# Patient Record
Sex: Female | Born: 1950 | Race: White | Hispanic: No | Marital: Single | Smoking: Never smoker
Health system: Southern US, Community
[De-identification: ages and names within clinical notes are randomized; demographics above are authoritative.]

## PROBLEM LIST (undated history)

## (undated) DIAGNOSIS — R112 Nausea with vomiting, unspecified: Secondary | ICD-10-CM

## (undated) DIAGNOSIS — I10 Essential (primary) hypertension: Secondary | ICD-10-CM

## (undated) DIAGNOSIS — I639 Cerebral infarction, unspecified: Secondary | ICD-10-CM

## (undated) DIAGNOSIS — Z960 Presence of urogenital implants: Secondary | ICD-10-CM

## (undated) DIAGNOSIS — J45909 Unspecified asthma, uncomplicated: Secondary | ICD-10-CM

## (undated) DIAGNOSIS — K219 Gastro-esophageal reflux disease without esophagitis: Secondary | ICD-10-CM

## (undated) DIAGNOSIS — M1990 Unspecified osteoarthritis, unspecified site: Secondary | ICD-10-CM

## (undated) DIAGNOSIS — I878 Other specified disorders of veins: Secondary | ICD-10-CM

## (undated) DIAGNOSIS — Z9889 Other specified postprocedural states: Secondary | ICD-10-CM

## (undated) HISTORY — PX: CHOLECYSTECTOMY: SHX55

## (undated) HISTORY — PX: OTHER SURGICAL HISTORY: SHX169

## (undated) HISTORY — PX: TONSILLECTOMY: SUR1361

## (undated) HISTORY — PX: ABDOMINAL HYSTERECTOMY: SHX81

---

## 2013-11-13 ENCOUNTER — Other Ambulatory Visit: Payer: Self-pay | Admitting: Sports Medicine

## 2013-11-13 DIAGNOSIS — M2390 Unspecified internal derangement of unspecified knee: Secondary | ICD-10-CM

## 2013-11-18 ENCOUNTER — Ambulatory Visit
Admission: RE | Admit: 2013-11-18 | Discharge: 2013-11-18 | Disposition: A | Discharge status: Home / Self Care | Payer: Medicare HMO | Source: Ambulatory Visit | Attending: Sports Medicine | Admitting: Sports Medicine

## 2013-11-18 DIAGNOSIS — M2390 Unspecified internal derangement of unspecified knee: Secondary | ICD-10-CM

## 2013-11-18 MED ORDER — IOHEXOL 180 MG/ML  SOLN
40.0000 mL | Freq: Once | INTRAMUSCULAR | Status: AC | PRN
  Administered 2013-11-18: 40 mL via INTRA_ARTICULAR

## 2016-05-03 NOTE — Progress Notes (Signed)
Scheduling pre op--please PLACE SURGICAL ORDERS IN EPIC  THANKS 

## 2016-05-06 NOTE — H&P (Signed)
TOTAL KNEE ADMISSION H&P  Patient is being admitted for left total knee arthroplasty.  Subjective:  Chief Complaint:    Left knee primary OA / pain  HPI: Regina Combs, 65 y.o. female, has a history of pain and functional disability in the left knee due to arthritis and has failed non-surgical conservative treatments for greater than 12 weeks to include NSAID's and/or analgesics, corticosteriod injections, viscosupplementation injections, use of assistive devices and activity modification.  Onset of symptoms was gradual, starting 2-3 years ago with gradually worsening course since that time. The patient noted prior procedures on the knee to include  arthroscopy on the left knee(s).  Patient currently rates pain in the left knee(s) at 7 out of 10 with activity. Patient has night pain, worsening of pain with activity and weight bearing, pain that interferes with activities of daily living, pain with passive range of motion, crepitus and joint swelling.  Patient has evidence of periarticular osteophytes and joint space narrowing by imaging studies.  There is no active infection.   Risks, benefits and expectations were discussed with the patient.  Risks including but not limited to the risk of anesthesia, blood clots, nerve damage, blood vessel damage, failure of the prosthesis, infection and up to and including death.  Patient understand the risks, benefits and expectations and wishes to proceed with surgery.   PCP: Leda QuailPURCELL, ANGELA, PA  D/C Plans:      Home  Post-op Meds:       No Rx given  Tranexamic Acid:      To be given - IV   Decadron:      Is to be given  FYI:     ASA  Norco  OPPT set up to start on 12/29     Past Medical History:  Diagnosis Date  . Arthritis    left knee osteoarthritis.  . Asthma    no recent issues since a yr ago-Uses Inhalers as needed.  Marland Kitchen. GERD (gastroesophageal reflux disease)    omeprazole helps.  . Hypertension   . PONV (postoperative nausea and vomiting)    . Poor venous access    "very difficult to get IV's"  . Status post implantation of urinary electronic stimulator device    right buttocks -remains inplace- Dr.Frank Mahalia Longestortora [id number][facility]  . Stroke [facility](HCC)    9'12 left sides [facility] x5 days.    Past Surgical History:  Procedure Laterality Date  . ABDOMINAL HYSTERECTOMY    . CHOLECYSTECTOMY     Laparoscopic - [facility], [place]  . TONSILLECTOMY    . Urinary Stimulator Right    buttocks-remains -has remote device.    No prescriptions prior to admission.   Allergies  Allergen Reactions  . Food Other (See Comments)    Egg whites-gastrointestinal symptoms  . Gluten Meal Other (See Comments)    gastrointestinal symptoms  . Sulfa Antibiotics Other (See Comments)    Childhood reaction; unsure of reaction.     Social History  Substance Use Topics  . Smoking status: Never Smoker  . Smokeless tobacco: Never Used  . Alcohol use Yes     Comment: social -rare       Review of Systems  Constitutional: Negative.   HENT: Negative.   Eyes: Negative.   Respiratory: Negative.   Cardiovascular: Negative.   Gastrointestinal: Positive for heartburn.  Genitourinary: Negative.   Musculoskeletal: Positive for joint pain.  Skin: Negative.   Neurological: Negative.   Endo/Heme/Allergies: Negative.  Psychiatric/Behavioral: Negative.     Objective:  Physical Exam  Constitutional: She is oriented to person, place, and time. She appears well-developed.  HENT:  Head: Normocephalic.  Eyes: Pupils are equal, round, and reactive to light.  Neck: Neck supple. No JVD present. No tracheal deviation present. No thyromegaly present.  Cardiovascular: Normal rate, regular rhythm, normal heart sounds and intact distal pulses.   Respiratory: Effort normal and breath sounds normal. No respiratory distress. She has no wheezes.  GI: Soft. There is no tenderness. There is no guarding.   Musculoskeletal:       Left knee: She exhibits decreased range of motion, swelling and bony tenderness. She exhibits no ecchymosis, no deformity, no laceration and no erythema. Tenderness found.  Lymphadenopathy:    She has no cervical adenopathy.  Neurological: She is alert and oriented to person, place, and time.  Skin: Skin is warm and dry.  Psychiatric: She has a normal mood and affect.      Imaging Review Plain radiographs demonstrate severe degenerative joint disease of the left knee. The bone quality appears to be good for age and reported activity level.  Assessment/Plan:  End stage arthritis, left knee   The patient history, physical examination, clinical judgment of the provider and imaging studies are consistent with end stage degenerative joint disease of the left knee and total knee arthroplasty is deemed medically necessary. The treatment options including medical management, injection therapy arthroscopy and arthroplasty were discussed at length. The risks and benefits of total knee arthroplasty were presented and reviewed. The risks due to aseptic loosening, infection, stiffness, patella tracking problems, thromboembolic complications and other imponderables were discussed. The patient acknowledged the explanation, agreed to proceed with the plan and consent was signed. Patient is being admitted for inpatient treatment for surgery, pain control, PT, OT, prophylactic antibiotics, VTE prophylaxis, progressive ambulation and ADL's and discharge planning. The patient is planning to be discharged home.      Anastasio AuerbachMatthew S. Dawnell Bryant   PA-C  05/22/2016, 9:09 AM

## 2016-05-15 NOTE — Patient Instructions (Addendum)
Regina CiproKaren Combs  05/15/2016   Your procedure is scheduled on: 05-22-16  [facility]  [facility] 3rd floor to  Short Stay Center at   1000 AM.  Call this number if you have problems the morning of surgery [phone]   Remember: ONLY 1 PERSON MAY GO WITH YOU TO SHORT STAY TO GET  READY MORNING OF YOUR SURGERY.  Do not eat food or drink liquids :After Midnight.Exception, may have from 12 midnight to 0600 AM- 2 cups Clear Juice or soft drink, then nothing.     Take these medicines the morning of surgery with A SIP OF WATER: Montelukast. Omeprazole.Regina Combs. Inhalers-usual. Reminder (Amlodipine) to be given on arrival. DO NOT TAKE ANY DIABETIC MEDICATIONS DAY OF YOUR SURGERY                               You may not have any metal on your body including hair pins and              piercings  Do not wear jewelry, make-up, lotions, powders or perfumes, deodorant             Do not wear nail polish.  Do not shave  48 hours prior to surgery.              Men may shave face and neck.   Do not bring valuables to the hospital. [facility] IS NOT             RESPONSIBLE   FOR VALUABLES.  Contacts, dentures or bridgework may not be worn into surgery.  Leave suitcase in the car. After surgery it may be brought to your room.     Patients discharged the day of surgery will not be allowed to drive home.  Name and phone number of your driver: Alycia RossettiRyan [phone] cell  Special Instructions: N/A              Please read over the following fact sheets you were given: _____________________________________________________________________             [facility] - Preparing [facility] Before surgery, you can play an important role.  Because skin is not sterile, your skin needs to be as free of germs as possible.  You can reduce the number of germs on your skin by washing with CHG (chlorahexidine gluconate) soap before surgery.  CHG is an  antiseptic cleaner which kills germs and bonds with the skin to continue killing germs even after washing. Please DO NOT use if you have an allergy to CHG or antibacterial soaps.  If your skin becomes reddened/irritated stop using the CHG and inform your nurse when you arrive at Short Stay. Do not shave (including legs and underarms) for at least 48 hours prior to the first CHG shower.  You may shave your face/neck. Please follow these instructions carefully:  1.  Shower with CHG Soap the night before surgery and the  morning of Surgery.  2.  If you choose to wash your hair, wash your hair first as usual with your  normal  shampoo.  3.  After you shampoo, rinse your hair and body thoroughly to remove the  shampoo.  4.  Use CHG as you would any other liquid soap.  You can apply chg directly  to the skin and wash                       Gently with a scrungie or clean washcloth.  5.  Apply the CHG Soap to your body ONLY FROM THE NECK DOWN.   Do not use on face/ open                           Wound or open sores. Avoid contact with eyes, ears mouth and genitals (private parts).                       Wash face,  Genitals (private parts) with your normal soap.             6.  Wash thoroughly, paying special attention to the area where your surgery  will be performed.  7.  Thoroughly rinse your body with warm water from the neck down.  8.  DO NOT shower/wash with your normal soap after using and rinsing off  the CHG Soap.                9.  Pat yourself dry with a clean towel.            10.  Wear clean pajamas.            11.  Place clean sheets on your bed the night of your first shower and do not  sleep with pets. Day of Surgery : Do not apply any lotions/deodorants the morning of surgery.  Please wear clean clothes to the hospital/surgery center.  FAILURE TO FOLLOW THESE INSTRUCTIONS MAY RESULT IN THE CANCELLATION OF YOUR SURGERY PATIENT  SIGNATURE_________________________________  NURSE SIGNATURE__________________________________  ________________________________________________________________________   Adam Phenix  An incentive spirometer is a tool that can help keep your lungs clear and active. This tool measures how well you are filling your lungs with each breath. Taking long deep breaths may help reverse or decrease the chance of developing breathing (pulmonary) problems (especially infection) following:  A long period of time when you are unable to move or be active. BEFORE THE PROCEDURE   If the spirometer includes an indicator to show your best effort, your nurse or respiratory therapist will set it to a desired goal.  If possible, sit up straight or lean slightly forward. Try not to slouch.  Hold the incentive spirometer in an upright position. INSTRUCTIONS FOR USE  1. Sit on the edge of your bed if possible, or sit up as far as you can in bed or on a chair. 2. Hold the incentive spirometer in an upright position. 3. Breathe out normally. 4. Place the mouthpiece in your mouth and seal your lips tightly around it. 5. Breathe in slowly and as deeply as possible, raising the piston or the ball toward the top of the column. 6. Hold your breath for 3-5 seconds or for as long as possible. Allow the piston or ball to fall to the bottom of the column. 7. Remove the mouthpiece from your mouth and breathe out normally. 8. Rest for a few seconds and repeat Steps 1 through 7 at least 10 times every 1-2 hours when you are awake. Take your time and take a few normal breaths between deep breaths. 9. The spirometer may include an indicator to show  your best effort. Use the indicator as a goal to work toward during each repetition. 10. After each set of 10 deep breaths, practice coughing to be sure your lungs are clear. If you have an incision (the cut made at the time of surgery), support your incision when coughing  by placing a pillow or rolled up towels firmly against it. Once you are able to get out of bed, walk around indoors and cough well. You may stop using the incentive spirometer when instructed by your caregiver.  RISKS AND COMPLICATIONS  Take your time so you do not get dizzy or light-headed.  If you are in pain, you may need to take or ask for pain medication before doing incentive spirometry. It is harder to take a deep breath if you are having pain. AFTER USE  Rest and breathe slowly and easily.  It can be helpful to keep track of a log of your progress. Your caregiver can provide you with a simple table to help with this. If you are using the spirometer at home, follow these instructions: [place] IF:   You are having difficultly using the spirometer.  You have trouble using the spirometer as often as instructed.  Your pain medication is not giving enough relief while using the spirometer.  You develop fever of 100.5 F (38.1 C) or higher. SEEK IMMEDIATE MEDICAL CARE IF:   You cough up bloody sputum that had not been present before.  You develop fever of 102 F (38.9 C) or greater.  You develop worsening pain at or near the incision site. MAKE SURE YOU:   Understand these instructions.  Will watch your condition.  Will get help right away if you are not doing well or get worse. Document Released: 09/24/2006 Document Revised: 08/06/2011 Document Reviewed: 11/25/2006 ExitCare Patient Information 2014 Exit[employer], [place].   ________________________________________________________________________  WHAT IS A BLOOD TRANSFUSION? Blood Transfusion Information  A transfusion is the replacement of blood or some of its parts. Blood is made up of multiple cells which provide different functions.  Red blood cells carry oxygen and are used for blood loss replacement.  White blood cells fight against infection.  Platelets control bleeding.  Plasma helps clot  blood.  Other blood products are available for specialized needs, such as hemophilia or other clotting disorders. BEFORE THE TRANSFUSION  Who gives blood for transfusions?   Healthy volunteers who are fully evaluated to make sure their blood is safe. This is blood bank blood. Transfusion therapy is the safest it has ever been in the practice of medicine. Before blood is taken from a donor, a complete history is taken to make sure that person has no history of diseases nor engages in risky social behavior (examples are intravenous drug use or sexual activity with multiple partners). The donor's travel history is screened to minimize risk of transmitting infections, such as malaria. The donated blood is tested for signs of infectious diseases, such as HIV and hepatitis. The blood is then tested to be sure it is compatible with you in order to minimize the chance of a transfusion reaction. If you or a relative donates blood, this is often done in anticipation of surgery and is not appropriate for emergency situations. It takes many days to process the donated blood. RISKS AND COMPLICATIONS Although transfusion therapy is very safe and saves many lives, the main dangers of transfusion include:   Getting an infectious disease.  Developing a transfusion reaction. This is an allergic reaction to  something in the blood you were given. Every precaution is taken to prevent this. The decision to have a blood transfusion has been considered carefully by your caregiver before blood is given. Blood is not given unless the benefits outweigh the risks. AFTER THE TRANSFUSION  Right after receiving a blood transfusion, you will usually feel much better and more energetic. This is especially true if your red blood cells have gotten low (anemic). The transfusion raises the level of the red blood cells which carry oxygen, and this usually causes an energy increase.  The nurse administering the transfusion will monitor  you carefully for complications. HOME CARE INSTRUCTIONS  No special instructions are needed after a transfusion. You may find your energy is better. Speak with your caregiver about any limitations on activity for underlying diseases you may have. SEEK MEDICAL CARE IF:   Your condition is not improving after your transfusion.  You develop redness or irritation at the intravenous (IV) site. SEEK IMMEDIATE MEDICAL CARE IF:  Any of the following symptoms occur over the next 12 hours:  Shaking chills.  You have a temperature by mouth above 102 F (38.9 C), not controlled by medicine.  Chest, back, or muscle pain.  People around you feel you are not acting correctly or are confused.  Shortness of breath or difficulty breathing.  Dizziness and fainting.  You get a rash or develop hives.  You have a decrease in urine output.  Your urine turns a dark color or changes to pink, red, or brown. Any of the following symptoms occur over the next 10 days:  You have a temperature by mouth above 102 F (38.9 C), not controlled by medicine.  Shortness of breath.  Weakness after normal activity.  The white part of the eye turns yellow (jaundice).  You have a decrease in the amount of urine or are urinating less often.  Your urine turns a dark color or changes to pink, red, or brown. Document Released: 05/11/2000 Document Revised: 08/06/2011 Document Reviewed: 12/29/2007 [facility] Patient Information 2014 [place], [place].  _______________________________________________________________________

## 2016-05-16 ENCOUNTER — Encounter (HOSPITAL_COMMUNITY)
Admission: RE | Admit: 2016-05-16 | Discharge: 2016-05-16 | Disposition: A | Discharge status: Home / Self Care | Payer: Medicare HMO | Source: Ambulatory Visit | Attending: Orthopedic Surgery | Admitting: Orthopedic Surgery

## 2016-05-16 ENCOUNTER — Encounter (HOSPITAL_COMMUNITY): Payer: Self-pay | Admitting: *Deleted

## 2016-05-16 DIAGNOSIS — Z01812 Encounter for preprocedural laboratory examination: Secondary | ICD-10-CM | POA: Diagnosis present

## 2016-05-16 HISTORY — DX: Other specified postprocedural states: Z98.890

## 2016-05-16 HISTORY — DX: Cerebral infarction, unspecified: I63.9

## 2016-05-16 HISTORY — DX: Unspecified asthma, uncomplicated: J45.909

## 2016-05-16 HISTORY — DX: Presence of urogenital implants: Z96.0

## 2016-05-16 HISTORY — DX: Unspecified osteoarthritis, unspecified site: M19.90

## 2016-05-16 HISTORY — DX: Other specified disorders of veins: I87.8

## 2016-05-16 HISTORY — DX: Nausea with vomiting, unspecified: R11.2

## 2016-05-16 HISTORY — DX: Essential (primary) hypertension: I10

## 2016-05-16 HISTORY — DX: Gastro-esophageal reflux disease without esophagitis: K21.9

## 2016-05-16 LAB — BASIC METABOLIC PANEL
ANION GAP: 9 (ref 5–15)
BUN: 24 mg/dL — AB (ref 6–20)
CO2: 26 mmol/L (ref 22–32)
Calcium: 9.8 mg/dL (ref 8.9–10.3)
Chloride: 103 mmol/L (ref 101–111)
Creatinine, Ser: 1.03 mg/dL — ABNORMAL HIGH (ref 0.44–1.00)
GFR calc Af Amer: >60 mL/min (ref >60)
GFR, EST NON AFRICAN AMERICAN: 57 mL/min — AB (ref >60)
Glucose, Bld: 105 mg/dL — ABNORMAL HIGH (ref 65–99)
POTASSIUM: 3.8 mmol/L (ref 3.5–5.1)
SODIUM: 138 mmol/L (ref 135–145)

## 2016-05-16 LAB — ABO/RH: ABO/RH(D): A POS

## 2016-05-16 LAB — CBC
HEMATOCRIT: 37.3 % (ref 36.0–46.0)
Hemoglobin: 12.7 g/dL (ref 12.0–15.0)
MCH: 28.5 pg (ref 26.0–34.0)
MCHC: 34 g/dL (ref 30.0–36.0)
MCV: 83.6 fL (ref 78.0–100.0)
Platelets: 397 10*3/uL (ref 150–400)
RBC: 4.46 MIL/uL (ref 3.87–5.11)
RDW: 13.7 % (ref 11.5–15.5)
WBC: 8.6 10*3/uL (ref 4.0–10.5)

## 2016-05-16 LAB — SURGICAL PCR SCREEN
MRSA, PCR: NEGATIVE
STAPHYLOCOCCUS AUREUS: NEGATIVE

## 2016-05-16 NOTE — Pre-Procedure Instructions (Signed)
EKG 5'17 Epic. Clearance-Angela Purcell,PA-C- with chart.

## 2016-05-22 ENCOUNTER — Inpatient Hospital Stay (HOSPITAL_COMMUNITY): Payer: Medicare HMO | Admitting: Registered Nurse

## 2016-05-22 ENCOUNTER — Encounter (HOSPITAL_COMMUNITY)
Admission: RE | Disposition: A | Discharge status: Home Health | Payer: Self-pay | Source: Ambulatory Visit | Attending: Orthopedic Surgery

## 2016-05-22 ENCOUNTER — Encounter (HOSPITAL_COMMUNITY): Payer: Self-pay | Admitting: *Deleted

## 2016-05-22 ENCOUNTER — Inpatient Hospital Stay (HOSPITAL_COMMUNITY)
Admission: RE | Admit: 2016-05-22 | Discharge: 2016-05-24 | DRG: 470 | Disposition: A | Discharge status: Home Health | Payer: Medicare HMO | Source: Ambulatory Visit | Attending: Orthopedic Surgery | Admitting: Orthopedic Surgery

## 2016-05-22 DIAGNOSIS — I10 Essential (primary) hypertension: Secondary | ICD-10-CM | POA: Diagnosis present

## 2016-05-22 DIAGNOSIS — Z8673 Personal history of transient ischemic attack (TIA), and cerebral infarction without residual deficits: Secondary | ICD-10-CM

## 2016-05-22 DIAGNOSIS — M25562 Pain in left knee: Secondary | ICD-10-CM | POA: Diagnosis present

## 2016-05-22 DIAGNOSIS — Z96659 Presence of unspecified artificial knee joint: Secondary | ICD-10-CM

## 2016-05-22 DIAGNOSIS — J45909 Unspecified asthma, uncomplicated: Secondary | ICD-10-CM | POA: Diagnosis present

## 2016-05-22 DIAGNOSIS — K219 Gastro-esophageal reflux disease without esophagitis: Secondary | ICD-10-CM | POA: Diagnosis present

## 2016-05-22 DIAGNOSIS — Z79899 Other long term (current) drug therapy: Secondary | ICD-10-CM | POA: Diagnosis not present

## 2016-05-22 DIAGNOSIS — M659 Synovitis and tenosynovitis, unspecified: Secondary | ICD-10-CM | POA: Diagnosis present

## 2016-05-22 DIAGNOSIS — M1712 Unilateral primary osteoarthritis, left knee: Secondary | ICD-10-CM | POA: Diagnosis present

## 2016-05-22 DIAGNOSIS — Z96652 Presence of left artificial knee joint: Secondary | ICD-10-CM

## 2016-05-22 HISTORY — PX: TOTAL KNEE ARTHROPLASTY: SHX125

## 2016-05-22 LAB — TYPE AND SCREEN
ABO/RH(D): A POS
Antibody Screen: NEGATIVE

## 2016-05-22 SURGERY — ARTHROPLASTY, KNEE, TOTAL
Anesthesia: General | Site: Knee | Laterality: Left

## 2016-05-22 MED ORDER — HYDROMORPHONE HCL 1 MG/ML IJ SOLN
INTRAMUSCULAR | Status: DC | PRN
  Administered 2016-05-22 (×2): 1 mg via INTRAVENOUS

## 2016-05-22 MED ORDER — METHOCARBAMOL 500 MG PO TABS: 500.0000 mg | ORAL_TABLET | Freq: Four times a day (QID) | ORAL | Status: DC | PRN

## 2016-05-22 MED ORDER — POLYETHYLENE GLYCOL 3350 17 G PO PACK
17.0000 g | PACK | Freq: Two times a day (BID) | ORAL | Status: DC
  Administered 2016-05-22 – 2016-05-24 (×4): 17 g via ORAL
  Filled 2016-05-22 (×4): qty 1

## 2016-05-22 MED ORDER — IRBESARTAN 150 MG PO TABS
150.0000 mg | ORAL_TABLET | Freq: Every day | ORAL | Status: DC
  Administered 2016-05-23 – 2016-05-24 (×2): 150 mg via ORAL
  Filled 2016-05-22 (×2): qty 1

## 2016-05-22 MED ORDER — ROPIVACAINE HCL 7.5 MG/ML IJ SOLN
INTRAMUSCULAR | Status: AC
Start: 2016-05-22 — End: 2016-05-22
  Filled 2016-05-22: qty 20

## 2016-05-22 MED ORDER — MIDAZOLAM HCL 2 MG/2ML IJ SOLN
INTRAMUSCULAR | Status: AC
  Filled 2016-05-22: qty 2

## 2016-05-22 MED ORDER — HYDROCODONE-ACETAMINOPHEN 7.5-325 MG PO TABS
1.0000 | ORAL_TABLET | ORAL | Status: DC
  Administered 2016-05-22 (×2): 1 via ORAL
  Administered 2016-05-23 (×5): 2 via ORAL
  Administered 2016-05-24 (×2): 1 via ORAL
  Filled 2016-05-22 (×3): qty 2
  Filled 2016-05-22 (×2): qty 1
  Filled 2016-05-22: qty 2
  Filled 2016-05-22: qty 1
  Filled 2016-05-22 (×2): qty 2

## 2016-05-22 MED ORDER — PROPOFOL 10 MG/ML IV BOLUS
INTRAVENOUS | Status: AC
  Filled 2016-05-22: qty 20

## 2016-05-22 MED ORDER — DIPHENHYDRAMINE HCL 25 MG PO CAPS: 25.0000 mg | ORAL_CAPSULE | Freq: Four times a day (QID) | ORAL | Status: DC | PRN

## 2016-05-22 MED ORDER — ONDANSETRON HCL 4 MG/2ML IJ SOLN
INTRAMUSCULAR | Status: AC
  Filled 2016-05-22: qty 2

## 2016-05-22 MED ORDER — LACTATED RINGERS IV SOLN
INTRAVENOUS | Status: DC | PRN
  Administered 2016-05-22 (×3): via INTRAVENOUS

## 2016-05-22 MED ORDER — SCOPOLAMINE 1 MG/3DAYS TD PT72
MEDICATED_PATCH | TRANSDERMAL | Status: DC | PRN
  Administered 2016-05-22: 1 via TRANSDERMAL

## 2016-05-22 MED ORDER — PROPOFOL 10 MG/ML IV BOLUS
INTRAVENOUS | Status: DC | PRN
  Administered 2016-05-22 (×2): 30 mg via INTRAVENOUS

## 2016-05-22 MED ORDER — FERROUS SULFATE 325 (65 FE) MG PO TABS
325.0000 mg | ORAL_TABLET | Freq: Three times a day (TID) | ORAL | Status: DC
  Administered 2016-05-23 – 2016-05-24 (×3): 325 mg via ORAL
  Filled 2016-05-22 (×3): qty 1

## 2016-05-22 MED ORDER — HYDROMORPHONE HCL 2 MG/ML IJ SOLN
INTRAMUSCULAR | Status: AC
  Filled 2016-05-22: qty 1

## 2016-05-22 MED ORDER — ONDANSETRON HCL 4 MG/2ML IJ SOLN
INTRAMUSCULAR | Status: DC | PRN
  Administered 2016-05-22 (×2): 4 mg via INTRAVENOUS

## 2016-05-22 MED ORDER — AMLODIPINE BESYLATE 5 MG PO TABS
5.0000 mg | ORAL_TABLET | Freq: Every day | ORAL | Status: DC
  Administered 2016-05-23: 5 mg via ORAL
  Filled 2016-05-22 (×2): qty 1

## 2016-05-22 MED ORDER — ASPIRIN 81 MG PO CHEW
81.0000 mg | CHEWABLE_TABLET | Freq: Two times a day (BID) | ORAL | Status: DC
Start: 2016-05-22 — End: 2016-05-24
  Administered 2016-05-22 – 2016-05-24 (×4): 81 mg via ORAL
  Filled 2016-05-22 (×4): qty 1

## 2016-05-22 MED ORDER — SCOPOLAMINE 1 MG/3DAYS TD PT72
MEDICATED_PATCH | TRANSDERMAL | Status: AC
  Filled 2016-05-22: qty 1

## 2016-05-22 MED ORDER — ONDANSETRON HCL 4 MG/2ML IJ SOLN: 4.0000 mg | Freq: Four times a day (QID) | INTRAMUSCULAR | Status: DC | PRN

## 2016-05-22 MED ORDER — CHLORHEXIDINE GLUCONATE 4 % EX LIQD: 60.0000 mL | Freq: Once | CUTANEOUS | Status: DC

## 2016-05-22 MED ORDER — ONDANSETRON HCL 4 MG PO TABS: 4.0000 mg | ORAL_TABLET | Freq: Four times a day (QID) | ORAL | Status: DC | PRN

## 2016-05-22 MED ORDER — SODIUM CHLORIDE 0.9 % IJ SOLN
INTRAMUSCULAR | Status: AC
  Filled 2016-05-22: qty 50

## 2016-05-22 MED ORDER — ROPIVACAINE HCL 7.5 MG/ML IJ SOLN
INTRAMUSCULAR | Status: DC | PRN
  Administered 2016-05-22: 20 mL via PERINEURAL

## 2016-05-22 MED ORDER — HYDROMORPHONE HCL 1 MG/ML IJ SOLN
0.2500 mg | INTRAMUSCULAR | Status: DC | PRN
  Administered 2016-05-22: 0.5 mg via INTRAVENOUS

## 2016-05-22 MED ORDER — KETOROLAC TROMETHAMINE 30 MG/ML IJ SOLN
INTRAMUSCULAR | Status: DC | PRN
  Administered 2016-05-22: 30 mg

## 2016-05-22 MED ORDER — HYDROCHLOROTHIAZIDE 25 MG PO TABS
25.0000 mg | ORAL_TABLET | Freq: Every day | ORAL | Status: DC
  Administered 2016-05-22 – 2016-05-24 (×3): 25 mg via ORAL
  Filled 2016-05-22 (×3): qty 1

## 2016-05-22 MED ORDER — AMLODIPINE-VALSARTAN-HCTZ 5-160-25 MG PO TABS: 1.0000 | ORAL_TABLET | Freq: Every day | ORAL | Status: DC

## 2016-05-22 MED ORDER — DEXAMETHASONE SODIUM PHOSPHATE 10 MG/ML IJ SOLN
10.0000 mg | Freq: Once | INTRAMUSCULAR | Status: AC
  Administered 2016-05-23: 13:00:00 10 mg via INTRAVENOUS
  Filled 2016-05-22: qty 1

## 2016-05-22 MED ORDER — OXYCODONE HCL 5 MG/5ML PO SOLN: 5.0000 mg | Freq: Once | ORAL | Status: DC | PRN

## 2016-05-22 MED ORDER — FENTANYL CITRATE (PF) 100 MCG/2ML IJ SOLN
INTRAMUSCULAR | Status: AC
  Filled 2016-05-22: qty 4

## 2016-05-22 MED ORDER — METHOCARBAMOL 1000 MG/10ML IJ SOLN
500.0000 mg | Freq: Four times a day (QID) | INTRAVENOUS | Status: DC | PRN
  Administered 2016-05-22: 500 mg via INTRAVENOUS
  Filled 2016-05-22: qty 5
  Filled 2016-05-22: qty 550

## 2016-05-22 MED ORDER — BUPIVACAINE HCL (PF) 0.25 % IJ SOLN
INTRAMUSCULAR | Status: AC
  Filled 2016-05-22: qty 30

## 2016-05-22 MED ORDER — METOCLOPRAMIDE HCL 5 MG PO TABS: 5.0000 mg | ORAL_TABLET | Freq: Three times a day (TID) | ORAL | Status: DC | PRN

## 2016-05-22 MED ORDER — CEFAZOLIN SODIUM-DEXTROSE 2-4 GM/100ML-% IV SOLN
2.0000 g | Freq: Four times a day (QID) | INTRAVENOUS | Status: AC
  Administered 2016-05-22 – 2016-05-23 (×2): 2 g via INTRAVENOUS
  Filled 2016-05-22 (×2): qty 100

## 2016-05-22 MED ORDER — BUPIVACAINE IN DEXTROSE 0.75-8.25 % IT SOLN
INTRATHECAL | Status: DC | PRN
  Administered 2016-05-22: 1.8 mL via INTRATHECAL

## 2016-05-22 MED ORDER — MENTHOL 3 MG MT LOZG: 1.0000 | LOZENGE | OROMUCOSAL | Status: DC | PRN

## 2016-05-22 MED ORDER — KETOROLAC TROMETHAMINE 30 MG/ML IJ SOLN
INTRAMUSCULAR | Status: AC
  Filled 2016-05-22: qty 1

## 2016-05-22 MED ORDER — DEXAMETHASONE SODIUM PHOSPHATE 10 MG/ML IJ SOLN
INTRAMUSCULAR | Status: AC
  Filled 2016-05-22: qty 1

## 2016-05-22 MED ORDER — CEFAZOLIN SODIUM-DEXTROSE 2-4 GM/100ML-% IV SOLN
INTRAVENOUS | Status: AC
  Filled 2016-05-22: qty 100

## 2016-05-22 MED ORDER — BUPIVACAINE HCL (PF) 0.25 % IJ SOLN
INTRAMUSCULAR | Status: DC | PRN
  Administered 2016-05-22: 30 mL

## 2016-05-22 MED ORDER — SCOPOLAMINE 1 MG/3DAYS TD PT72
MEDICATED_PATCH | TRANSDERMAL | Status: AC
Start: 2016-05-22 — End: 2016-05-22
  Filled 2016-05-22: qty 1

## 2016-05-22 MED ORDER — FENTANYL CITRATE (PF) 100 MCG/2ML IJ SOLN
50.0000 ug | INTRAMUSCULAR | Status: AC | PRN
  Administered 2016-05-22 (×4): 50 ug via INTRAVENOUS
  Administered 2016-05-22: 100 ug via INTRAVENOUS

## 2016-05-22 MED ORDER — CEFAZOLIN SODIUM-DEXTROSE 2-4 GM/100ML-% IV SOLN
2.0000 g | INTRAVENOUS | Status: AC
  Administered 2016-05-22: 2 g via INTRAVENOUS
  Filled 2016-05-22: qty 100

## 2016-05-22 MED ORDER — HYDROMORPHONE HCL 1 MG/ML IJ SOLN
INTRAMUSCULAR | Status: AC
  Filled 2016-05-22: qty 1

## 2016-05-22 MED ORDER — HYDROMORPHONE HCL 1 MG/ML IJ SOLN
0.5000 mg | INTRAMUSCULAR | Status: DC | PRN
  Administered 2016-05-22: 0.5 mg via INTRAVENOUS
  Filled 2016-05-22: qty 1

## 2016-05-22 MED ORDER — STERILE WATER FOR IRRIGATION IR SOLN
Status: DC | PRN
  Administered 2016-05-22: 2000 mL

## 2016-05-22 MED ORDER — SODIUM CHLORIDE 0.9 % IJ SOLN
INTRAMUSCULAR | Status: DC | PRN
  Administered 2016-05-22: 30 mL

## 2016-05-22 MED ORDER — TRANEXAMIC ACID 1000 MG/10ML IV SOLN
1000.0000 mg | INTRAVENOUS | Status: AC
  Administered 2016-05-22: 1000 mg via INTRAVENOUS
  Filled 2016-05-22: qty 1100

## 2016-05-22 MED ORDER — SODIUM CHLORIDE 0.9 % IR SOLN
Status: DC | PRN
  Administered 2016-05-22: 1000 mL

## 2016-05-22 MED ORDER — AMLODIPINE BESYLATE 5 MG PO TABS
5.0000 mg | ORAL_TABLET | ORAL | Status: AC
  Administered 2016-05-22: 5 mg via ORAL
  Filled 2016-05-22: qty 1

## 2016-05-22 MED ORDER — PHENOL 1.4 % MT LIQD
1.0000 | OROMUCOSAL | Status: DC | PRN
  Filled 2016-05-22: qty 177

## 2016-05-22 MED ORDER — MIDAZOLAM HCL 5 MG/5ML IJ SOLN
INTRAMUSCULAR | Status: DC | PRN
  Administered 2016-05-22: 2 mg via INTRAVENOUS

## 2016-05-22 MED ORDER — PROPOFOL 500 MG/50ML IV EMUL
INTRAVENOUS | Status: DC | PRN
  Administered 2016-05-22: 75 ug/kg/min via INTRAVENOUS

## 2016-05-22 MED ORDER — SCOPOLAMINE 1 MG/3DAYS TD PT72: 1.0000 | MEDICATED_PATCH | Freq: Once | TRANSDERMAL | Status: DC

## 2016-05-22 MED ORDER — PROMETHAZINE HCL 25 MG/ML IJ SOLN
6.2500 mg | INTRAMUSCULAR | Status: DC | PRN
  Administered 2016-05-22: 6.25 mg via INTRAVENOUS

## 2016-05-22 MED ORDER — SODIUM CHLORIDE 0.9 % IV SOLN
INTRAVENOUS | Status: DC
  Administered 2016-05-22: 18:00:00 via INTRAVENOUS
  Filled 2016-05-22 (×9): qty 1000

## 2016-05-22 MED ORDER — NON FORMULARY: 20.0000 mg | Freq: Every day | Status: DC

## 2016-05-22 MED ORDER — MIDAZOLAM HCL 2 MG/2ML IJ SOLN: 1.0000 mg | INTRAMUSCULAR | Status: DC | PRN

## 2016-05-22 MED ORDER — PROPOFOL 10 MG/ML IV BOLUS
INTRAVENOUS | Status: AC
  Filled 2016-05-22: qty 60

## 2016-05-22 MED ORDER — DEXAMETHASONE SODIUM PHOSPHATE 10 MG/ML IJ SOLN
10.0000 mg | Freq: Once | INTRAMUSCULAR | Status: AC
  Administered 2016-05-22: 10 mg via INTRAVENOUS

## 2016-05-22 MED ORDER — FENTANYL CITRATE (PF) 100 MCG/2ML IJ SOLN
INTRAMUSCULAR | Status: AC
  Filled 2016-05-22: qty 2

## 2016-05-22 MED ORDER — MIDAZOLAM HCL 5 MG/5ML IJ SOLN: INTRAMUSCULAR | Status: DC | PRN

## 2016-05-22 MED ORDER — METOCLOPRAMIDE HCL 5 MG/ML IJ SOLN: 5.0000 mg | Freq: Three times a day (TID) | INTRAMUSCULAR | Status: DC | PRN

## 2016-05-22 MED ORDER — OXYCODONE HCL 5 MG PO TABS: 5.0000 mg | ORAL_TABLET | Freq: Once | ORAL | Status: DC | PRN

## 2016-05-22 MED ORDER — ALUM & MAG HYDROXIDE-SIMETH 200-200-20 MG/5ML PO SUSP: 30.0000 mL | ORAL | Status: DC | PRN

## 2016-05-22 MED ORDER — DOCUSATE SODIUM 100 MG PO CAPS
100.0000 mg | ORAL_CAPSULE | Freq: Two times a day (BID) | ORAL | Status: DC
  Administered 2016-05-22 – 2016-05-24 (×4): 100 mg via ORAL
  Filled 2016-05-22 (×4): qty 1

## 2016-05-22 MED ORDER — BISACODYL 10 MG RE SUPP: 10.0000 mg | Freq: Every day | RECTAL | Status: DC | PRN

## 2016-05-22 MED ORDER — LACTATED RINGERS IV SOLN
INTRAVENOUS | Status: DC
  Administered 2016-05-22: 1000 mL via INTRAVENOUS

## 2016-05-22 MED ORDER — MAGNESIUM CITRATE PO SOLN: 1.0000 | Freq: Once | ORAL | Status: DC | PRN

## 2016-05-22 MED ORDER — ALBUTEROL SULFATE (2.5 MG/3ML) 0.083% IN NEBU: 3.0000 mL | INHALATION_SOLUTION | Freq: Four times a day (QID) | RESPIRATORY_TRACT | Status: DC | PRN

## 2016-05-22 MED ORDER — MONTELUKAST SODIUM 10 MG PO TABS
10.0000 mg | ORAL_TABLET | Freq: Every day | ORAL | Status: DC
  Administered 2016-05-23 – 2016-05-24 (×2): 10 mg via ORAL
  Filled 2016-05-22 (×2): qty 1

## 2016-05-22 MED ORDER — OMEPRAZOLE 20 MG PO CPDR
20.0000 mg | DELAYED_RELEASE_CAPSULE | Freq: Every day | ORAL | Status: DC
  Administered 2016-05-23 – 2016-05-24 (×2): 20 mg via ORAL
  Filled 2016-05-22 (×2): qty 1

## 2016-05-22 MED ORDER — 0.9 % SODIUM CHLORIDE (POUR BTL) OPTIME
TOPICAL | Status: DC | PRN
  Administered 2016-05-22: 1000 mL

## 2016-05-22 SURGICAL SUPPLY — 44 items
BAG DECANTER FOR FLEXI CONT (MISCELLANEOUS) IMPLANT
BAG ZIPLOCK 12X15 (MISCELLANEOUS) IMPLANT
BANDAGE ACE 6X5 VEL STRL LF (GAUZE/BANDAGES/DRESSINGS) ×3 IMPLANT
BLADE SAW SGTL 13.0X1.19X90.0M (BLADE) ×3 IMPLANT
BOWL SMART MIX CTS (DISPOSABLE) ×3 IMPLANT
CAPT KNEE TOTAL 3 ATTUNE ×3 IMPLANT
CEMENT HV SMART SET (Cement) ×6 IMPLANT
CLOTH BEACON ORANGE TIMEOUT ST (SAFETY) ×3 IMPLANT
CUFF TOURN SGL QUICK 34 (TOURNIQUET CUFF) ×2
CUFF TRNQT CYL 34X4X40X1 (TOURNIQUET CUFF) ×1 IMPLANT
DECANTER SPIKE VIAL GLASS SM (MISCELLANEOUS) ×3 IMPLANT
DERMABOND ADVANCED (GAUZE/BANDAGES/DRESSINGS) ×2
DERMABOND ADVANCED .7 DNX12 (GAUZE/BANDAGES/DRESSINGS) ×1 IMPLANT
DRAPE U-SHAPE 47X51 STRL (DRAPES) ×3 IMPLANT
DRESSING AQUACEL AG SP 3.5X10 (GAUZE/BANDAGES/DRESSINGS) ×1 IMPLANT
DRSG AQUACEL AG SP 3.5X10 (GAUZE/BANDAGES/DRESSINGS) ×3
DURAPREP 26ML APPLICATOR (WOUND CARE) ×6 IMPLANT
ELECT REM PT RETURN 9FT ADLT (ELECTROSURGICAL) ×3
ELECTRODE REM PT RTRN 9FT ADLT (ELECTROSURGICAL) ×1 IMPLANT
GLOVE BIOGEL M 7.0 STRL (GLOVE) IMPLANT
GLOVE BIOGEL PI IND STRL 7.5 (GLOVE) ×1 IMPLANT
GLOVE BIOGEL PI IND STRL 8.5 (GLOVE) ×1 IMPLANT
GLOVE BIOGEL PI INDICATOR 7.5 (GLOVE) ×2
GLOVE BIOGEL PI INDICATOR 8.5 (GLOVE) ×2
GLOVE ECLIPSE 8.0 STRL XLNG CF (GLOVE) ×3 IMPLANT
GLOVE ORTHO TXT STRL SZ7.5 (GLOVE) ×6 IMPLANT
GOWN STRL REUS W/TWL LRG LVL3 (GOWN DISPOSABLE) ×3 IMPLANT
GOWN STRL REUS W/TWL XL LVL3 (GOWN DISPOSABLE) ×3 IMPLANT
HANDPIECE INTERPULSE COAX TIP (DISPOSABLE) ×2
MANIFOLD NEPTUNE II (INSTRUMENTS) ×3 IMPLANT
PACK TOTAL KNEE CUSTOM (KITS) ×3 IMPLANT
POSITIONER SURGICAL ARM (MISCELLANEOUS) ×3 IMPLANT
SET HNDPC FAN SPRY TIP SCT (DISPOSABLE) ×1 IMPLANT
SET PAD KNEE POSITIONER (MISCELLANEOUS) ×3 IMPLANT
SUT MNCRL AB 4-0 PS2 18 (SUTURE) ×3 IMPLANT
SUT VIC AB 1 CT1 36 (SUTURE) ×3 IMPLANT
SUT VIC AB 2-0 CT1 27 (SUTURE) ×6
SUT VIC AB 2-0 CT1 TAPERPNT 27 (SUTURE) ×3 IMPLANT
SUT VLOC 180 0 24IN GS25 (SUTURE) ×3 IMPLANT
SYR 50ML LL SCALE MARK (SYRINGE) ×3 IMPLANT
TRAY FOLEY CATH 14FR (SET/KITS/TRAYS/PACK) ×3 IMPLANT
WATER STERILE IRR 1500ML POUR (IV SOLUTION) ×3 IMPLANT
WRAP KNEE MAXI GEL POST OP (GAUZE/BANDAGES/DRESSINGS) ×3 IMPLANT
YANKAUER SUCT BULB TIP 10FT TU (MISCELLANEOUS) ×3 IMPLANT

## 2016-05-22 NOTE — Progress Notes (Signed)
Assisted Dr. Hodierne with left, ultrasound guided, adductor canal block. Side rails up, monitors on throughout procedure. See vital signs in flow sheet. Tolerated Procedure well.  

## 2016-05-22 NOTE — Op Note (Signed)
NAME:  Regina Combs                      MEDICAL RECORD NO.:  [id number]                             FACILITY:  [facility]      PHYSICIAN:  Madlyn Frankel. Charlann Boxer, M.D.  DATE OF BIRTH:  [birth year]      DATE OF PROCEDURE:  05/22/2016                                     OPERATIVE REPORT         PREOPERATIVE DIAGNOSIS:  Left knee osteoarthritis.      POSTOPERATIVE DIAGNOSIS:  Left knee osteoarthritis.      FINDINGS:  The patient was noted to have complete loss of cartilage and   bone-on-bone arthritis with associated osteophytes in the lateral and patellofemoral compartments of   the knee with a significant synovitis and associated effusion.      PROCEDURE:  Left total knee replacement.      COMPONENTS USED:  DePuy Attune rotating platform posterior stabilized knee   system, a size 6N femur, 4 tibia, size 5 PS AOX insert, and 32 anatomic patellar   button.      SURGEON:  Madlyn Frankel. Charlann Boxer, M.D.      ASSISTANT:  Lanney Gins, PA-C.      ANESTHESIA:  Spinal plus LMA     SPECIMENS:  None.      COMPLICATION:  None.      DRAINS:  None.  EBL: <100cc      TOURNIQUET TIME:   Total Tourniquet Time Documented: Calf (Left) - 32 minutes Total: Calf (Left) - 32 minutes       The patient was stable to the recovery room.      INDICATION FOR PROCEDURE:  Regina Combs is a 65 y.o. female patient of   mine.  The patient had been seen, evaluated, and treated conservatively in the   office with medication, activity modification, and injections.  The patient had   radiographic changes of bone-on-bone arthritis with endplate sclerosis and osteophytes noted.      The patient failed conservative measures including medication, injections, and activity modification, and at this point was ready for more definitive measures.   Based on the radiographic changes and failed conservative measures, the patient   decided to proceed with total knee replacement.  Risks of infection,   DVT, component failure,  need for revision surgery, postop course, and   expectations were all   discussed and reviewed.  Consent was obtained for benefit of pain   relief.      PROCEDURE IN DETAIL:  The patient was brought to the operative theater.   Once adequate anesthesia, preoperative antibiotics, 2 gm of Ancef, 1 gm of Tranexamic Acid, and 10 mg of Decadron administered, the patient was positioned supine with the left thigh tourniquet placed.  The  left lower extremity was prepped and draped in sterile fashion.  A time-   out was performed identifying the patient, planned procedure, and   extremity.      The left lower extremity was placed in the [facility] leg holder.  The leg was   exsanguinated, tourniquet elevated to 250 mmHg.  A midline incision was   made followed by median  parapatellar arthrotomy.  Following initial   exposure, attention was first directed to the patella.  Precut   measurement was noted to be 21 mm.  I resected down to 13 mm and used a   32 anatomic patellar button to restore patellar height as well as cover the cut   surface.      The lug holes were drilled and a metal shim was placed to protect the   patella from retractors and saw blades.      At this point, attention was now directed to the femur.  The femoral   canal was opened with a drill, irrigated to try to prevent fat emboli.  An   intramedullary rod was passed at 3 degrees valgus, 9 mm of bone was   resected off the distal femur.  Following this resection, the tibia was   subluxated anteriorly.  Using the extramedullary guide, 4 mm of bone was resected off   the proximal medial tibia.  We confirmed the gap would be   stable medially and laterally with a size 5 spacer block as well as confirmed   the cut was perpendicular in the coronal plane, checking with an alignment rod.      Once this was done, I sized the femur to be a size 6 in the anterior-   posterior dimension, chose a narrow component based on medial and    lateral dimension.  The size 6 rotation block was then pinned in   position anterior referenced using the C-clamp to set rotation.  The   anterior, posterior, and  chamfer cuts were made without difficulty nor   notching making certain that I was along the anterior cortex to help   with flexion gap stability.      The final box cut was made off the lateral aspect of distal femur.      At this point, the tibia was sized to be a size 4, the size 4 tray was   then pinned in position through the medial third of the tubercle,   drilled, and keel punched.  Trial reduction was now carried with a 6 femur,  4 tibia, a size 5 PS insert, and the 32 anatomic patella botton.  The knee was brought to   extension, full extension with good flexion stability with the patella   tracking through the trochlea without application of pressure.  Given   all these findings the femoral lug holes were drilled and then the trial components removed.  Final components were   opened and cement was mixed.  The knee was irrigated with normal saline   solution and pulse lavage.  The synovial lining was   then injected with 30 cc 0.25% Marcaine without epinephrine and 1 cc of Toradol plus 30 cc of NS for a total of 61 cc.      The knee was irrigated.  Final implants were then cemented onto clean and   dried cut surfaces of bone with the knee brought to extension with a size 5 PS trial insert.      Once the cement had fully cured, the excess cement was removed   throughout the knee.  I confirmed I was satisfied with the range of   motion and stability, and the final size 5 PS AOX insert was chosen.  It was   placed into the knee.      The tourniquet had been let down at 32 minutes.  No significant   hemostasis  required.  The   extensor mechanism was then reapproximated using #1 Vicryl and #0 V-lock sutures with the knee   in flexion.  The   remaining wound was closed with 2-0 Vicryl and running 4-0 Monocryl.   The knee  was cleaned, dried, dressed sterilely using Dermabond and   Aquacel dressing.  The patient was then   brought to recovery room in stable condition, tolerating the procedure   well.   Please note that Physician Assistant, Lanney GinsMatthew Babish, PA-C, was present for the entirety of the case, and was utilized for pre-operative positioning, peri-operative retractor management, general facilitation of the procedure.  He was also utilized for primary wound closure at the end of the case.              Madlyn FrankelMatthew D. Charlann Boxerlin, M.D.    05/22/2016 1:44 PM

## 2016-05-22 NOTE — Discharge Instructions (Signed)

## 2016-05-22 NOTE — Anesthesia Postprocedure Evaluation (Signed)
Anesthesia Post Note  Patient: Regina CiproKaren Combs  Procedure(s) Performed: Procedure(s) (LRB): LEFT TOTAL KNEE ARTHROPLASTY (Left)  Patient location during evaluation: PACU Anesthesia Type: General Level of consciousness: awake and alert and patient cooperative Pain management: pain level controlled Vital Signs Assessment: post-procedure vital signs reviewed and stable Respiratory status: spontaneous breathing and respiratory function stable Cardiovascular status: stable Anesthetic complications: no       Last Vitals:  Vitals:   05/22/16 1545 05/22/16 1600  BP: 120/70 126/64  Pulse: 79 82  Resp: 11 11  Temp:      Last Pain:  Vitals:   05/22/16 1545  TempSrc:   PainSc: 5                  Jerris Keltz S

## 2016-05-22 NOTE — Anesthesia Preprocedure Evaluation (Signed)
Anesthesia Evaluation  Patient identified by MRN, date of birth, ID band Patient awake    Reviewed: Allergy & Precautions, H&P , NPO status , Patient's Chart, lab work & pertinent test results  History of Anesthesia Complications (+) PONV and history of anesthetic complications  Airway Mallampati: II   Neck ROM: full    Dental   Pulmonary asthma ,    breath sounds clear to auscultation       Cardiovascular hypertension,  Rhythm:regular Rate:Normal     Neuro/Psych CVA, Residual Symptoms    GI/Hepatic GERD  ,  Endo/Other  obese  Renal/GU      Musculoskeletal  (+) Arthritis ,   Abdominal   Peds  Hematology   Anesthesia Other Findings   Reproductive/Obstetrics                             Anesthesia Physical Anesthesia Plan  ASA: III  Anesthesia Plan: MAC and Spinal   Post-op Pain Management:  Regional for Post-op pain   Induction: Intravenous  Airway Management Planned: Simple Face Mask  Additional Equipment:   Intra-op Plan:   Post-operative Plan:   Informed Consent: I have reviewed the patients History and Physical, chart, labs and discussed the procedure including the risks, benefits and alternatives for the proposed anesthesia with the patient or authorized representative who has indicated his/her understanding and acceptance.     Plan Discussed with: CRNA, Anesthesiologist and Surgeon  Anesthesia Plan Comments:         Anesthesia Quick Evaluation

## 2016-05-22 NOTE — Interval H&P Note (Signed)
History and Physical Interval Note:  05/22/2016 11:22 AM  Jodell CiproKaren Donoghue  has presented today for surgery, with the diagnosis of Left knee osteoarthritis  The various methods of treatment have been discussed with the patient and family. After consideration of risks, benefits and other options for treatment, the patient has consented to  Procedure(s): LEFT TOTAL KNEE ARTHROPLASTY (Left) as a surgical intervention .  The patient's history has been reviewed, patient examined, no change in status, stable for surgery.  I have reviewed the patient's chart and labs.  Questions were answered to the patient's satisfaction.     Shelda PalLIN,Altie Savard D

## 2016-05-22 NOTE — Anesthesia Procedure Notes (Signed)
Spinal  Patient location during procedure: OR End time: 05/22/2016 12:27 PM Staffing Resident/CRNA: Enrigue Catena E Performed: anesthesiologist and resident/CRNA  Preanesthetic Checklist Completed: patient identified, site marked, surgical consent, pre-op evaluation, timeout performed, IV checked, risks and benefits discussed and monitors and equipment checked Spinal Block Patient position: sitting Prep: DuraPrep Patient monitoring: heart rate, continuous pulse ox and blood pressure Approach: midline Location: L3-4 Injection technique: single-shot Needle Needle type: Sprotte and Pencan  Needle gauge: 25 G Needle length: 9 cm Additional Notes Expiration date of kit checked and confirmed. Patient tolerated procedure well, without complications.

## 2016-05-22 NOTE — Anesthesia Procedure Notes (Signed)
Procedure Name: LMA Insertion Date/Time: 05/22/2016 12:54 PM Performed by: Anastasio ChampionEVANS, Xadrian Craighead E Pre-anesthesia Checklist: Patient identified, Emergency Drugs available, Suction available and Patient being monitored Patient Re-evaluated:Patient Re-evaluated prior to inductionOxygen Delivery Method: Circle system utilized Preoxygenation: Pre-oxygenation with 100% oxygen Intubation Type: IV induction Ventilation: Mask ventilation without difficulty LMA: LMA inserted LMA Size: 4.0 Tube type: Oral Number of attempts: 1 Placement Confirmation: positive ETCO2 Tube secured with: Tape Dental Injury: Teeth and Oropharynx as per pre-operative assessment  Comments: Pt  Feeling incision  Converted to GA LMA. Dr. Chaney MallingHodierne callled

## 2016-05-22 NOTE — Anesthesia Procedure Notes (Signed)
Anesthesia Regional Block:  Adductor canal block  Pre-Anesthetic Checklist: ,, timeout performed, Correct Patient, Correct Site, Correct Laterality, Correct Procedure, Correct Position, site marked, Risks and benefits discussed,  Surgical consent,  Pre-op evaluation,  At surgeon's request and post-op pain management  Laterality: Left  Prep: chloraprep       Needles:  Injection technique: Single-shot  Needle Type: Echogenic Needle     Needle Length: 9cm 9 cm Needle Gauge: 21 and 21 G    Additional Needles:  Procedures: ultrasound guided (picture in chart) Adductor canal block Narrative:  Start time: 05/22/2016 11:42 AM End time: 05/22/2016 11:52 AM Injection made incrementally with aspirations every 5 mL.  Performed by: Personally  Anesthesiologist: Merci Walthers  Additional Notes: Pt tolerated the procedure well.

## 2016-05-22 NOTE — Transfer of Care (Signed)
Immediate Anesthesia Transfer of Care Note  Patient: Jodell CiproKaren Sarr  Procedure(s) Performed: Procedure(s): LEFT TOTAL KNEE ARTHROPLASTY (Left)  Patient Location: PACU  Anesthesia Type:General  Level of Consciousness: awake, alert , oriented and patient cooperative  Airway & Oxygen Therapy: Patient Spontanous Breathing and Patient connected to face mask oxygen  Post-op Assessment: Report given to RN, Post -op Vital signs reviewed and stable and Patient moving all extremities X 4  Post vital signs: stable  Last Vitals:  Vitals:   05/22/16 1211 05/22/16 1212  BP:    Pulse: 79 77  Resp: 15 15  Temp:      Last Pain:  Vitals:   05/22/16 1134  TempSrc:   PainSc: 2       Patients Stated Pain Goal: 3 (05/22/16 1134)  Complications: No apparent anesthesia complications  Used TIVA  2ndary  To PONV

## 2016-05-23 LAB — BASIC METABOLIC PANEL
Anion gap: 7 (ref 5–15)
BUN: 23 mg/dL — AB (ref 6–20)
CO2: 23 mmol/L (ref 22–32)
CREATININE: 0.88 mg/dL (ref 0.44–1.00)
Calcium: 8.7 mg/dL — ABNORMAL LOW (ref 8.9–10.3)
Chloride: 104 mmol/L (ref 101–111)
GFR calc Af Amer: >60 mL/min (ref >60)
Glucose, Bld: 140 mg/dL — ABNORMAL HIGH (ref 65–99)
POTASSIUM: 3.7 mmol/L (ref 3.5–5.1)
SODIUM: 134 mmol/L — AB (ref 135–145)

## 2016-05-23 LAB — CBC
HEMATOCRIT: 30.7 % — AB (ref 36.0–46.0)
HEMOGLOBIN: 10.4 g/dL — AB (ref 12.0–15.0)
MCH: 28.9 pg (ref 26.0–34.0)
MCHC: 33.9 g/dL (ref 30.0–36.0)
MCV: 85.3 fL (ref 78.0–100.0)
Platelets: 293 10*3/uL (ref 150–400)
RBC: 3.6 MIL/uL — ABNORMAL LOW (ref 3.87–5.11)
RDW: 13.6 % (ref 11.5–15.5)
WBC: 11.5 10*3/uL — ABNORMAL HIGH (ref 4.0–10.5)

## 2016-05-23 MED ORDER — POLYETHYLENE GLYCOL 3350 17 G PO PACK: 17.0000 g | PACK | Freq: Two times a day (BID) | ORAL | 0 refills | Status: DC

## 2016-05-23 MED ORDER — METHOCARBAMOL 500 MG PO TABS: 500.0000 mg | ORAL_TABLET | Freq: Four times a day (QID) | ORAL | 0 refills | Status: DC | PRN

## 2016-05-23 MED ORDER — HYDROCODONE-ACETAMINOPHEN 7.5-325 MG PO TABS: 1.0000 | ORAL_TABLET | ORAL | 0 refills | Status: DC | PRN

## 2016-05-23 MED ORDER — DOCUSATE SODIUM 100 MG PO CAPS: 100.0000 mg | ORAL_CAPSULE | Freq: Two times a day (BID) | ORAL | 0 refills | Status: DC

## 2016-05-23 MED ORDER — ASPIRIN 81 MG PO CHEW: 81.0000 mg | CHEWABLE_TABLET | Freq: Two times a day (BID) | ORAL | 0 refills | Status: AC

## 2016-05-23 MED ORDER — FERROUS SULFATE 325 (65 FE) MG PO TABS: 325.0000 mg | ORAL_TABLET | Freq: Three times a day (TID) | ORAL | 3 refills | Status: DC

## 2016-05-23 NOTE — Progress Notes (Signed)
Physical Therapy Treatment Patient Details Name: Regina Combs MRN: [id number] DOB: 1950-09-03 Today's Date: 05/23/2016    History of Present Illness s/p L TKA and with hx of CVA with residual L side weakness    PT Comments    Increased activity tolerance and confidence in ability noted.  Pt hopeful for dc home tomorrow.  Follow Up Recommendations  Home health PT     Equipment Recommendations  Rolling walker with 5" wheels    Recommendations for Other Services OT consult     Precautions / Restrictions Precautions Precautions: Knee;Fall Restrictions Weight Bearing Restrictions: No Other Position/Activity Restrictions: WBAT    Mobility  Bed Mobility Overal bed mobility: Needs Assistance Bed Mobility: Supine to Sit;Sit to Supine     Supine to sit: Min guard Sit to supine: Min guard   General bed mobility comments: cues for sequence and use of R LE to self assist  Transfers Overall transfer level: Needs assistance Equipment used: Rolling walker (2 wheeled) Transfers: Sit to/from Stand Sit to Stand: Min guard         General transfer comment: cues for LE management and use of UEs to self assist  Ambulation/Gait Ambulation/Gait assistance: Min assist;Min guard Ambulation Distance (Feet): 94 Feet (and 18' twice to/from bathroom) Assistive device: Rolling walker (2 wheeled) Gait Pattern/deviations: Step-to pattern;Step-through pattern;Decreased step length - right;Decreased step length - left;Shuffle;Trunk flexed Gait velocity: decr Gait velocity interpretation: Below normal speed for age/gender General Gait Details: cues for sequence, posture and position from [employer]   Stairs            Wheelchair Mobility    Modified Rankin (Stroke Patients Only)       Balance                                    Cognition Arousal/Alertness: Awake/alert Behavior During Therapy: WFL for tasks assessed/performed Overall Cognitive Status: Within  Functional Limits for tasks assessed                      Exercises Total Joint Exercises Ankle Circles/Pumps: AROM;15 reps;Supine;Both Quad Sets: AROM;Both;10 reps;Supine    General Comments        Pertinent Vitals/Pain Pain Assessment: 0-10 Pain Score: 6  Pain Location: LLE Pain Descriptors / Indicators: Aching Pain Intervention(s): Limited activity within patient's tolerance;Monitored during session;Premedicated before session;Ice applied    Home Living                      Prior Function            PT Goals (current goals can now be found in the care plan section) Acute Rehab PT Goals Patient Stated Goal: return to independence PT Goal Formulation: With patient Time For Goal Achievement: 05/26/16 Potential to Achieve Goals: Good Progress towards PT goals: Progressing toward goals    Frequency    7X/week      PT Plan Current plan remains appropriate    Co-evaluation             End of Session Equipment Utilized During Treatment: Gait belt Activity Tolerance: Patient tolerated treatment well;Patient limited by fatigue Patient left: in bed;with call bell/phone within reach;with family/visitor present     Time: [id number] PT Time Calculation (min) (ACUTE ONLY): 26 min  Charges:  $Gait Training: 8-22 mins $Therapeutic Activity: 8-22 mins  G Codes:      Aramis Weil 05/23/2016, 5:14 PM

## 2016-05-23 NOTE — Evaluation (Signed)
Occupational Therapy Evaluation Patient Details Name: Regina CiproKaren Combs MRN: [id number] DOB: 02-12-1951 Today's Date: 05/23/2016    History of Present Illness s/p L TKA   Clinical Impression   This 65 year old female was admitted for the above.  She will benefit from one more session of OT to review accessible shower transfer, ambulate to bathroom and use reacher for LB adls    Follow Up Recommendations  Supervision/Assistance - 24 hour    Equipment Recommendations  None recommended by OT    Recommendations for Other Services       Precautions / Restrictions Precautions Precautions: Knee;Fall Restrictions Weight Bearing Restrictions: No Other Position/Activity Restrictions: WBAT      Mobility Bed Mobility Overal bed mobility: Needs Assistance Bed Mobility: Supine to Sit     Supine to sit: Min assist     General bed mobility comments: for LLE  Transfers Overall transfer level: Needs assistance Equipment used: Rolling walker (2 wheeled) Transfers: Sit to/from Stand Sit to Stand: Min guard         General transfer comment: for safety    Balance                                            ADL Overall ADL's : Needs assistance/impaired     Grooming: Set up   Upper Body Bathing: Set up;Sitting   Lower Body Bathing: Minimal assistance;Sit to/from stand   Upper Body Dressing : Set up;Sitting   Lower Body Dressing: Moderate assistance;Sit to/from stand   Toilet Transfer: Min guard;Stand-pivot;BSC;RW   Toileting- ArchitectClothing Manipulation and Hygiene: Min guard;Sit to/from stand         General ADL Comments: took a couple steps to chair after using 3:1.  Pt has a reacher at home, which she can use for adls     Vision     Perception     Praxis      Pertinent Vitals/Pain Pain Assessment: 0-10 Pain Score: 5  Pain Location: LLE with weight bearing Pain Descriptors / Indicators: Sore Pain Intervention(s): Limited activity within  patient's tolerance;Monitored during session;Premedicated before session;Repositioned;Ice applied     Hand Dominance     Extremity/Trunk Assessment Upper Extremity Assessment Upper Extremity Assessment: Overall WFL for tasks assessed           Communication Communication Communication: No difficulties   Cognition Arousal/Alertness: Awake/alert Behavior During Therapy: WFL for tasks assessed/performed Overall Cognitive Status: Within Functional Limits for tasks assessed                     General Comments       Exercises       Shoulder Instructions      Home Living Family/patient expects to be discharged to:: Private residence Living Arrangements: Alone                 Bathroom Shower/Tub:  (walk in tub with seat)   Bathroom Toilet: Standard         Additional Comments: son and daughter in law staying for 2 weeks.  Pt bought an ETS with safety frame      Prior Functioning/Environment Level of Independence: Independent                 OT Problem List: Decreased activity tolerance;Decreased knowledge of use of DME or AE;Pain   OT Treatment/Interventions: Self-care/ADL training;DME and/or  AE instruction;Patient/family education    OT Goals(Current goals can be found in the care plan section) Acute Rehab OT Goals Patient Stated Goal: return to independence OT Goal Formulation: With patient Time For Goal Achievement: 05/24/16 Potential to Achieve Goals: Good ADL Goals Pt Will Perform Lower Body Dressing: with supervision;with adaptive equipment;sit to/from stand (pants) Pt Will Transfer to Toilet: with supervision;ambulating;bedside commode Pt Will Perform Tub/Shower Transfer: Tub transfer;ambulating;with min guard assist;shower seat (accessible)  OT Frequency: Min 2X/week   Barriers to D/C:            Co-evaluation              End of Session Nurse Communication:  (NT to replace white band:  too tight)  Activity Tolerance:  Patient tolerated treatment well Patient left: in chair;with call bell/phone within reach   Time: [id number] OT Time Calculation (min): 22 min Charges:  OT General Charges $OT Visit: 1 Procedure OT Evaluation $OT Eval Low Complexity: 1 Procedure G-Codes:    Vennie Waymire 05/23/2016, 9:04 AM Marica OtterMaryellen Shannia Jacuinde, OTR/L [phone] 05/23/2016

## 2016-05-23 NOTE — Care Management Note (Signed)
Case Management Note  Patient Details  Name: Haila Dena MRN: [id number] Date of Birth: 02-Dec-1950  Subjective/Objective:                  LEFT TOTAL KNEE ARTHROPLASTY (Left)  Action/Plan: Discharge planning Expected Discharge Date:  05/23/16               Expected Discharge Plan:  [place]  In-House Referral:     Discharge planning Services  CM Consult  Post Acute Care Choice:  Home Health Choice offered to:  Patient  DME Arranged:  Walker rolling DME Agency:  [place] Arranged:  PT [facility] Agency:  [facility]  Status of Service:  Completed, signed off  If discussed at [place] of Stay Meetings, dates discussed:    Additional Comments: CM met with pt in room to offer choice of home health agency. Pt chooses [facility] to render HHPT. Referral called to [facility] rep, Jermaine.  CM notified [facility] rep, Larene Beach to please deliver the rolling walker to room prior to discharge.  No other Cm needs were communicated. Dellie Catholic, RN 05/23/2016, 1:58 PM

## 2016-05-23 NOTE — Evaluation (Signed)
Physical Therapy Evaluation Patient Details Name: Regina Combs MRN: [id number] DOB: [birth year] Today's Date: 05/23/2016   History of Present Illness  s/p L TKA and with hx of CVA with residual L side weakness  Clinical Impression  Pt s/p L TKR presents with decreased L LE strength/ROM and post op pain limiting functional mobility.  Pt should progress to dc home with family assist and HHPT follow up.    Follow Up Recommendations Home health PT    Equipment Recommendations  Rolling walker with 5" wheels    Recommendations for Other Services OT consult     Precautions / Restrictions Precautions Precautions: Knee;Fall Restrictions Weight Bearing Restrictions: No Other Position/Activity Restrictions: WBAT      Mobility  Bed Mobility Overal bed mobility: Needs Assistance Bed Mobility: Supine to Sit     Supine to sit: Min assist Sit to supine: Min assist   General bed mobility comments: for LLE  Transfers Overall transfer level: Needs assistance Equipment used: Rolling walker (2 wheeled) Transfers: Sit to/from Stand Sit to Stand: Min assist         General transfer comment: cues for LE management and use of UEs to self assist  Ambulation/Gait Ambulation/Gait assistance: Min assist Ambulation Distance (Feet): 50 Feet (and 18' into bathroom) Assistive device: Rolling walker (2 wheeled) Gait Pattern/deviations: Step-to pattern;Step-through pattern;Decreased step length - right;Decreased step length - left;Shuffle;Trunk flexed Gait velocity: decr Gait velocity interpretation: Below normal speed for age/gender General Gait Details: cues for sequence, posture and position from [employer]  Stairs            Wheelchair Mobility    Modified Rankin (Stroke Patients Only)       Balance Overall balance assessment: No apparent balance deficits (not formally assessed)                                           Pertinent Vitals/Pain Pain Assessment:  0-10 Pain Score: 6  Pain Location: LLE Pain Descriptors / Indicators: Aching Pain Intervention(s): Limited activity within patient's tolerance;Monitored during session;Premedicated before session    Home Living Family/patient expects to be discharged to:: Private residence Living Arrangements: Alone Available Help at Discharge: Family Type of Home: House Home Access: Stairs to enter Entrance Stairs-Rails: None Entrance Stairs-Number of Steps: 2 Home Layout: Able to live on main level with bedroom/bathroom Home Equipment: Gilmer MorCane - single point Additional Comments: son and daughter in law staying for 2 weeks.  Pt bought an ETS with safety frame    Prior Function Level of Independence: Independent               Hand Dominance        Extremity/Trunk Assessment   Upper Extremity Assessment Upper Extremity Assessment: Overall WFL for tasks assessed    Lower Extremity Assessment Lower Extremity Assessment: LLE deficits/detail LLE Deficits / Details: 2+/5 quads with AAROM at knee -10 - 35 with muscle guarding    Cervical / Trunk Assessment Cervical / Trunk Assessment: Normal  Communication   Communication: No difficulties  Cognition Arousal/Alertness: Awake/alert Behavior During Therapy: WFL for tasks assessed/performed Overall Cognitive Status: Within Functional Limits for tasks assessed                      General Comments      Exercises Total Joint Exercises Ankle Circles/Pumps: AROM;15 reps;Supine;Both Quad Sets: AROM;Both;10 reps;Supine Heel Slides: AAROM;Left;Supine;10  reps Straight Leg Raises: AAROM;Left;10 reps;Supine   Assessment/Plan    PT Assessment Patient needs continued PT services  PT Problem List Decreased strength;Decreased range of motion;Decreased activity tolerance;Decreased mobility;Decreased knowledge of use of DME;Pain          PT Treatment Interventions DME instruction;Gait training;Stair training;Functional mobility  training;Therapeutic activities;Therapeutic exercise;Patient/family education    PT Goals (Current goals can be found in the Care Plan section)  Acute Rehab PT Goals Patient Stated Goal: return to independence PT Goal Formulation: With patient Time For Goal Achievement: 05/26/16 Potential to Achieve Goals: Good    Frequency 7X/week   Barriers to discharge        Co-evaluation               End of Session Equipment Utilized During Treatment: Gait belt Activity Tolerance: Patient limited by pain Patient left: Other (comment) (Bathroom with OT) Nurse Communication: Mobility status         Time: [id number] PT Time Calculation (min) (ACUTE ONLY): 37 min   Charges:   PT Evaluation $PT Eval Low Complexity: 1 Procedure PT Treatments $Therapeutic Exercise: 8-22 mins   PT G Codes:        Imanol Bihl 05/23/2016, 12:58 PM

## 2016-05-23 NOTE — Progress Notes (Signed)
     Subjective: 1 Day Post-Op Procedure(s) (LRB): LEFT TOTAL KNEE ARTHROPLASTY (Left)   Patient reports pain as mild, pain controlled.  No events throughout the night.  Ready to be discharged home if she does well with PT.   Objective:   VITALS:   Vitals:   05/23/16 0151 05/23/16 0553  BP: (!) 124/51 124/62  Pulse: 61 65  Resp: 18 18  Temp: 98.1 F (36.7 C) 97.7 F (36.5 C)    Dorsiflexion/Plantar flexion intact Incision: dressing C/D/I No cellulitis present Compartment soft  LABS  Recent Labs  05/23/16 0425  HGB 10.4*  HCT 30.7*  WBC 11.5*  PLT 293     Recent Labs  05/23/16 0425  NA 134*  K 3.7  BUN 23*  CREATININE 0.88  GLUCOSE 140*     Assessment/Plan: 1 Day Post-Op Procedure(s) (LRB): LEFT TOTAL KNEE ARTHROPLASTY (Left) Foley cath d/c'ed Advance diet Up with therapy D/C IV fluids Discharge home [facility] Follow up with OLIN,Sharine Cadle D in 2 weeks.  Contact information:  [facility] [address], [address] [place] [id number] [phone]    Obese (BMI 30-39.9) Estimated body mass index is 34.33 kg/m as calculated from the following:   Height as of this encounter: 5\' 4"  (1.626 m).   Weight as of this encounter: 90.7 kg (200 lb). Patient also counseled that weight may inhibit the healing process Patient counseled that losing weight will help with future health issues       Anastasio AuerbachMatthew S. Santez Woodcox   PAC  05/23/2016, 9:10 AM

## 2016-05-23 NOTE — Progress Notes (Signed)
   05/23/16 1200  OT Visit Information  Last OT Received On 05/23/16  Assistance Needed +1  History of Present Illness s/p L TKA  Precautions  Precautions Knee;Fall  Pain Assessment  Pain Score 6  Pain Location LLE  Pain Descriptors / Indicators Aching  Pain Intervention(s) Limited activity within patient's tolerance;Monitored during session;Premedicated before session;Repositioned;Ice applied  Cognition  Arousal/Alertness Awake/alert  Behavior During Therapy WFL for tasks assessed/performed  Overall Cognitive Status Within Functional Limits for tasks assessed  ADL  Grooming Supervision/safety;Wash/dry Administratorhands;Standing  Toilet Transfer Min guard;Ambulation;BSC;RW  Toileting- DealerClothing Manipulation and Hygiene Min guard;Sit to/from stand  General ADL Comments educated pt and her son on accessible tub transfer:  pt did not want to practice at this time due to pain.  They feel like they will be able to manage this at home  Bed Mobility  Sit to supine Min assist  General bed mobility comments for LLE  Restrictions  Other Position/Activity Restrictions WBAT  Transfers  Equipment used Rolling walker (2 wheeled)  Sit to Stand Min guard  General transfer comment for safety  OT - End of Session  Activity Tolerance Patient tolerated treatment well  Patient left in chair;with call bell/phone within reach  OT Assessment/Plan  Follow Up Recommendations Supervision/Assistance - 24 hour  OT Equipment None recommended by OT  OT Goal Progression  Progress towards OT goals Progressing toward goals (pt verbalizes understanding of all doesn't need continued OT)  OT Time Calculation  OT Start Time (ACUTE ONLY) 1144  OT Stop Time (ACUTE ONLY) 1157  OT Time Calculation (min) 13 min  OT General Charges  $OT Visit 1 Procedure  OT Treatments  $Self Care/Home Management  8-22 mins  Marica OtterMaryellen Jevaughn Degollado, OTR/L [phone] 05/23/2016

## 2016-05-24 LAB — CBC
HCT: 30.1 % — ABNORMAL LOW (ref 36.0–46.0)
Hemoglobin: 10.3 g/dL — ABNORMAL LOW (ref 12.0–15.0)
MCH: 29.3 pg (ref 26.0–34.0)
MCHC: 34.2 g/dL (ref 30.0–36.0)
MCV: 85.5 fL (ref 78.0–100.0)
PLATELETS: 315 10*3/uL (ref 150–400)
RBC: 3.52 MIL/uL — AB (ref 3.87–5.11)
RDW: 13.9 % (ref 11.5–15.5)
WBC: 13.4 10*3/uL — AB (ref 4.0–10.5)

## 2016-05-24 LAB — BASIC METABOLIC PANEL
Anion gap: 7 (ref 5–15)
BUN: 22 mg/dL — AB (ref 6–20)
CALCIUM: 9.6 mg/dL (ref 8.9–10.3)
CO2: 29 mmol/L (ref 22–32)
Chloride: 104 mmol/L (ref 101–111)
Creatinine, Ser: 1.01 mg/dL — ABNORMAL HIGH (ref 0.44–1.00)
GFR calc Af Amer: >60 mL/min (ref >60)
GFR, EST NON AFRICAN AMERICAN: 58 mL/min — AB (ref >60)
GLUCOSE: 115 mg/dL — AB (ref 65–99)
POTASSIUM: 3.8 mmol/L (ref 3.5–5.1)
SODIUM: 140 mmol/L (ref 135–145)

## 2016-05-24 NOTE — Progress Notes (Signed)
Physical Therapy Treatment Patient Details Name: Regina Combs Trinka MRN: [id number] DOB: Mar 28, 1951 Today's Date: 05/24/2016    History of Present Illness s/p L TKA and with hx of CVA with residual L side weakness    PT Comments    Pt progressing well with mobility and eager for dc home.  Reviewed theres and stairs with son present and assisting.  Follow Up Recommendations  Home health PT     Equipment Recommendations  Rolling walker with 5" wheels    Recommendations for Other Services OT consult     Precautions / Restrictions Precautions Precautions: Knee;Fall Restrictions Weight Bearing Restrictions: No Other Position/Activity Restrictions: WBAT    Mobility  Bed Mobility Overal bed mobility: Needs Assistance Bed Mobility: Supine to Sit;Sit to Supine     Supine to sit: Min guard Sit to supine: Min guard   General bed mobility comments: cues for sequence and use of R LE to self assist  Transfers Overall transfer level: Needs assistance Equipment used: Rolling walker (2 wheeled) Transfers: Sit to/from Stand Sit to Stand: Supervision         General transfer comment: cues for LE management and use of UEs to self assist  Ambulation/Gait Ambulation/Gait assistance: Min guard;Supervision Ambulation Distance (Feet): 60 Feet Assistive device: Rolling walker (2 wheeled) Gait Pattern/deviations: Step-to pattern;Step-through pattern;Decreased step length - right;Decreased step length - left;Shuffle;Trunk flexed Gait velocity: decr Gait velocity interpretation: Below normal speed for age/gender General Gait Details: cues for sequence, posture and position from RW   Stairs Stairs: Yes   Stair Management: No rails;Step to pattern;Backwards;With walker Number of Stairs: 4 General stair comments: 2 stairs twice with RW bkwd; son and DIL assisting on second attempt  Wheelchair Mobility    Modified Rankin (Stroke Patients Only)       Balance                                    Cognition Arousal/Alertness: Awake/alert Behavior During Therapy: WFL for tasks assessed/performed Overall Cognitive Status: Within Functional Limits for tasks assessed                      Exercises Total Joint Exercises Ankle Circles/Pumps: AROM;15 reps;Supine;Both Quad Sets: AROM;Both;Supine;15 reps Heel Slides: AAROM;Left;Supine;15 reps Straight Leg Raises: AAROM;Left;Supine;20 reps Goniometric ROM: AAROM L knee - -10-60    General Comments        Pertinent Vitals/Pain Pain Assessment: 0-10 Pain Score: 5  Pain Location: LLE Pain Descriptors / Indicators: Aching Pain Intervention(s): Limited activity within patient's tolerance;Monitored during session;Premedicated before session;Ice applied    Home Living                      Prior Function            PT Goals (current goals can now be found in the care plan section) Acute Rehab PT Goals Patient Stated Goal: return to independence PT Goal Formulation: With patient Time For Goal Achievement: 05/26/16 Potential to Achieve Goals: Good Progress towards PT goals: Progressing toward goals    Frequency    7X/week      PT Plan Current plan remains appropriate    Co-evaluation             End of Session Equipment Utilized During Treatment: Gait belt Activity Tolerance: Patient tolerated treatment well Patient left: in chair;with call bell/phone within reach;with family/visitor present  Time: [id number] PT Time Calculation (min) (ACUTE ONLY): 33 min  Charges:  $Gait Training: 8-22 mins $Therapeutic Exercise: 8-22 mins                    G Codes:      Yahia Bottger 05/24/2016, 12:41 PM

## 2016-05-24 NOTE — Progress Notes (Signed)
     Subjective: 2 Days Post-Op Procedure(s) (LRB): LEFT TOTAL KNEE ARTHROPLASTY (Left)   Patient reports pain as mild, pain controlled.  No events throughout the night.  States that she slept well and is up sitting in the chair this morning.  Says that the most difficult action at this time is getting up from a seated position.  She knows that this muscle strength will eventually come back in time.  Ready to be discharged today.  Objective:   VITALS:   Vitals:   05/23/16 2224 05/24/16 0559  BP: 132/61 (!) 120/55  Pulse: 75 63  Resp: 18 18  Temp: 98.5 F (36.9 C) 98.4 F (36.9 C)    Dorsiflexion/Plantar flexion intact Incision: dressing C/D/I No cellulitis present Compartment soft  LABS  Recent Labs  05/23/16 0425 05/24/16 0423  HGB 10.4* 10.3*  HCT 30.7* 30.1*  WBC 11.5* 13.4*  PLT 293 315     Recent Labs  05/23/16 0425 05/24/16 0423  NA 134* 140  K 3.7 3.8  BUN 23* 22*  CREATININE 0.88 1.01*  GLUCOSE 140* 115*     Assessment/Plan: 2 Days Post-Op Procedure(s) (LRB): LEFT TOTAL KNEE ARTHROPLASTY (Left) Up with therapy Discharge home [facility]. Follow up with OLIN,Tanganyika Bowlds D in 2 weeks.  Contact information:  [facility] [address] [place] [id number] [phone]        Anastasio AuerbachMatthew S. Khadeeja Elden   PAC  05/24/2016, 7:20 AM

## 2016-05-24 NOTE — Progress Notes (Signed)
Pt was discharged home today. Instructions were reviewed with patient, prescriptions were given  and questions were answered. Pt was taken to main entrance via wheelchair by NT.  

## 2016-05-25 NOTE — Discharge Summary (Signed)
Physician Discharge Summary  Patient ID: Regina Combs MRN: [id number] DOB/AGE: [birth year] 63 y.o.  Admit date: 05/22/2016 Discharge date: 05/24/2016   Procedures:  Procedure(s) (LRB): LEFT TOTAL KNEE ARTHROPLASTY (Left)  Attending Physician:  Dr. Durene Romans   Admission Diagnoses:   Left knee primary OA / pain  Discharge Diagnoses:  Principal Problem:   S/P left TKA Active Problems:   S/P knee replacement  Past Medical History:  Diagnosis Date  . Arthritis    left knee osteoarthritis.  . Asthma    no recent issues since a yr ago-Uses Inhalers as needed.  Marland Kitchen GERD (gastroesophageal reflux disease)    omeprazole helps.  . Hypertension   . PONV (postoperative nausea and vomiting)    states scopalamine patches helped N and V in past  . Poor venous access    "very difficult to get IV's"  . Status post implantation of urinary electronic stimulator device    right buttocks -remains inplace- Dr.Frank Mahalia Longest [id number] Urology ,[facility]  . Stroke [facility])    9'12 left sides weakness [facility] Stay of x5 days.    HPI:    Regina Combs, 65 y.o. female, has a history of pain and functional disability in the left knee due to arthritis and has failed non-surgical conservative treatments for greater than 12 weeks to include NSAID's and/or analgesics, corticosteriod injections, viscosupplementation injections, use of assistive devices and activity modification.  Onset of symptoms was gradual, starting 2-3 years ago with gradually worsening course since that time. The patient noted prior procedures on the knee to include  arthroscopy on the left knee(s).  Patient currently rates pain in the left knee(s) at 7 out of 10 with activity. Patient has night pain, worsening of pain with activity and weight bearing, pain that interferes with activities of daily living, pain with passive range of motion, crepitus and joint swelling.  Patient has evidence of periarticular osteophytes  and joint space narrowing by imaging studies.  There is no active infection.   Risks, benefits and expectations were discussed with the patient.  Risks including but not limited to the risk of anesthesia, blood clots, nerve damage, blood vessel damage, failure of the prosthesis, infection and up to and including death.  Patient understand the risks, benefits and expectations and wishes to proceed with surgery.   PCP: Leda Quail, PA   Discharged Condition: good  Hospital Course:  Patient underwent the above stated procedure on 05/22/2016. Patient tolerated the procedure well and brought to the recovery room in good condition and subsequently to the floor.  POD #1 BP: 124/62 ; Pulse: 65 ; Temp: 97.7 F (36.5 C) ; Resp: 18 Patient reports pain as mild, pain controlled.  No events throughout the night.  Dorsiflexion/plantar flexion intact, incision: dressing C/D/I, no cellulitis present and compartment soft.   LABS  Basename    HGB     10.4  HCT     30.7   POD #2  BP: 120/55 ; Pulse: 63 ; Temp: 98.4 F (36.9 C) ; Resp: 18 Patient reports pain as mild, pain controlled.  No events throughout the night.  States that she slept well and is up sitting in the chair this morning.  Says that the most difficult action at this time is getting up from a seated position.  She knows that this muscle strength will eventually come back in time.  Ready to be discharged today. Dorsiflexion/plantar flexion intact, incision: dressing C/D/I, no cellulitis present and compartment soft.   LABS  Basename    HGB     10.3  HCT     30.1    Discharge Exam: General appearance: alert, cooperative and no distress Extremities: Homans sign is negative, no sign of DVT, no edema, redness or tenderness in the calves or thighs and no ulcers, gangrene or trophic changes  Disposition: Home with follow up in 2 weeks   Follow-up Information    Shelda PalLIN,Dontaye Hur D, MD. Schedule an appointment as soon as possible for a visit  in 2 week(s).   Specialty:  Orthopedic Surgery Contact information: [address] [place] [id number] [phone]        [facility] Follow up.   Why:  rolling walker  Contact information: [address] [place] [address] [phone]        [facility] Follow up.   Why:  physical therapy Contact information: [address] [place] [address] [phone]           Discharge Instructions    Call MD / Call 911    Complete by:  As directed    If you experience chest pain or shortness of breath, CALL 911 and be transported to the hospital emergency room.  If you develope a fever above 101 F, pus (white drainage) or increased drainage or redness at the wound, or calf pain, call your surgeon's office.   Change dressing    Complete by:  As directed    Maintain surgical dressing until follow up in the clinic. If the edges start to pull up, may reinforce with tape. If the dressing is no longer working, may remove and cover with gauze and tape, but must keep the area dry and clean.  Call with any questions or concerns.   Constipation Prevention    Complete by:  As directed    Drink plenty of fluids.  Prune juice may be helpful.  You may use a stool softener, such as Colace (over the counter) 100 mg twice a day.  Use MiraLax (over the counter) for constipation as needed.   Diet - low sodium heart healthy    Complete by:  As directed    Discharge instructions    Complete by:  As directed    Maintain surgical dressing until follow up in the clinic. If the edges start to pull up, may reinforce with tape. If the dressing is no longer working, may remove and cover with gauze and tape, but must keep the area dry and clean.  [facility]. Call with any questions or concerns.   Increase activity slowly as tolerated    Complete by:  As directed    Weight bearing as tolerated  with assist device (walker, cane, etc) as directed, use it as long as suggested by your surgeon or therapist, typically at least 4-6 weeks.   TED hose    Complete by:  As directed    Use stockings (TED hose) for 2 weeks on both leg(s).  You may remove them at night for sleeping.      Allergies as of 05/24/2016      Reactions   Food Other (See Comments)   Egg whites-gastrointestinal symptoms   Gluten Meal Other (See Comments)   gastrointestinal symptoms   Sulfa Antibiotics Other (See Comments)   Childhood reaction; unsure of reaction.      Medication List    STOP taking these medications   aspirin  EC 81 MG tablet Replaced by:  aspirin 81 MG chewable tablet   naproxen sodium 220 MG tablet Commonly known as:  ANAPROX     TAKE these medications   Amlodipine-Valsartan-HCTZ 5-160-25 MG Tabs Take 1 tablet by mouth daily.   aspirin 81 MG chewable tablet Chew 1 tablet (81 mg total) by mouth 2 (two) times daily. Take for 4 weeks, then resume regular dose. Replaces:  aspirin EC 81 MG tablet   cholecalciferol 1000 units tablet Commonly known as:  VITAMIN D Take 1,000 Units by mouth daily.   docusate sodium 100 MG capsule Commonly known as:  COLACE Take 1 capsule (100 mg total) by mouth 2 (two) times daily.   ferrous sulfate 325 (65 FE) MG tablet Take 1 tablet (325 mg total) by mouth 3 (three) times daily after meals.   HYDROcodone-acetaminophen 7.5-325 MG tablet Commonly known as:  NORCO Take 1-2 tablets by mouth every 4 (four) hours as needed for moderate pain.   methocarbamol 500 MG tablet Commonly known as:  ROBAXIN Take 1 tablet (500 mg total) by mouth every 6 (six) hours as needed for muscle spasms.   montelukast 10 MG tablet Commonly known as:  SINGULAIR Take 10 mg by mouth daily.   omeprazole 20 MG capsule Commonly known as:  PRILOSEC Take 20 mg by mouth daily before breakfast.   polyethylene glycol packet Commonly known as:  MIRALAX / GLYCOLAX Take 17 g by  mouth 2 (two) times daily.   VENTOLIN HFA 108 (90 Base) MCG/ACT inhaler Generic drug:  albuterol Inhale 1-2 puffs into the lungs every 6 (six) hours as needed for wheezing or shortness of breath.   vitamin B-12 1000 MCG tablet Commonly known as:  CYANOCOBALAMIN Take 1,000 mcg by mouth daily.        Signed: Anastasio AuerbachMatthew S. Bryann Gentz   PA-C  05/25/2016, 7:59 AM

## 2016-05-29 ENCOUNTER — Encounter (HOSPITAL_COMMUNITY): Payer: Self-pay | Admitting: Orthopedic Surgery

## 2017-02-19 ENCOUNTER — Other Ambulatory Visit: Payer: Self-pay | Admitting: Medical

## 2017-02-19 DIAGNOSIS — R5381 Other malaise: Secondary | ICD-10-CM

## 2017-03-13 ENCOUNTER — Other Ambulatory Visit: Payer: Self-pay | Admitting: Medical

## 2017-03-13 DIAGNOSIS — M8580 Other specified disorders of bone density and structure, unspecified site: Secondary | ICD-10-CM

## 2017-03-13 DIAGNOSIS — Z1231 Encounter for screening mammogram for malignant neoplasm of breast: Secondary | ICD-10-CM

## 2017-03-27 ENCOUNTER — Emergency Department (HOSPITAL_COMMUNITY): Payer: Medicare HMO

## 2017-03-27 ENCOUNTER — Emergency Department (HOSPITAL_COMMUNITY)
Admission: EM | Admit: 2017-03-27 | Discharge: 2017-03-27 | Disposition: A | Discharge status: Home / Self Care | Payer: Medicare HMO | Attending: Emergency Medicine | Admitting: Emergency Medicine

## 2017-03-27 ENCOUNTER — Encounter (HOSPITAL_COMMUNITY): Payer: Self-pay | Admitting: *Deleted

## 2017-03-27 DIAGNOSIS — Z96652 Presence of left artificial knee joint: Secondary | ICD-10-CM | POA: Diagnosis not present

## 2017-03-27 DIAGNOSIS — J988 Other specified respiratory disorders: Secondary | ICD-10-CM | POA: Insufficient documentation

## 2017-03-27 DIAGNOSIS — Z7982 Long term (current) use of aspirin: Secondary | ICD-10-CM | POA: Diagnosis not present

## 2017-03-27 DIAGNOSIS — E876 Hypokalemia: Secondary | ICD-10-CM | POA: Insufficient documentation

## 2017-03-27 DIAGNOSIS — R0602 Shortness of breath: Secondary | ICD-10-CM | POA: Diagnosis present

## 2017-03-27 DIAGNOSIS — Z79899 Other long term (current) drug therapy: Secondary | ICD-10-CM | POA: Diagnosis not present

## 2017-03-27 DIAGNOSIS — E860 Dehydration: Secondary | ICD-10-CM | POA: Insufficient documentation

## 2017-03-27 DIAGNOSIS — I10 Essential (primary) hypertension: Secondary | ICD-10-CM | POA: Diagnosis not present

## 2017-03-27 DIAGNOSIS — R911 Solitary pulmonary nodule: Secondary | ICD-10-CM

## 2017-03-27 DIAGNOSIS — J45909 Unspecified asthma, uncomplicated: Secondary | ICD-10-CM | POA: Diagnosis not present

## 2017-03-27 LAB — CBC
HCT: 32.8 % — ABNORMAL LOW (ref 36.0–46.0)
Hemoglobin: 11 g/dL — ABNORMAL LOW (ref 12.0–15.0)
MCH: 27.5 pg (ref 26.0–34.0)
MCHC: 33.5 g/dL (ref 30.0–36.0)
MCV: 82 fL (ref 78.0–100.0)
PLATELETS: 388 10*3/uL (ref 150–400)
RBC: 4 MIL/uL (ref 3.87–5.11)
RDW: 14.7 % (ref 11.5–15.5)
WBC: 11.7 10*3/uL — AB (ref 4.0–10.5)

## 2017-03-27 LAB — BASIC METABOLIC PANEL
Anion gap: 13 (ref 5–15)
BUN: 12 mg/dL (ref 6–20)
CO2: 24 mmol/L (ref 22–32)
Calcium: 9.5 mg/dL (ref 8.9–10.3)
Chloride: 96 mmol/L — ABNORMAL LOW (ref 101–111)
Creatinine, Ser: 1.16 mg/dL — ABNORMAL HIGH (ref 0.44–1.00)
GFR, EST AFRICAN AMERICAN: 56 mL/min — AB (ref >60)
GFR, EST NON AFRICAN AMERICAN: 49 mL/min — AB (ref >60)
Glucose, Bld: 101 mg/dL — ABNORMAL HIGH (ref 65–99)
POTASSIUM: 2.8 mmol/L — AB (ref 3.5–5.1)
SODIUM: 133 mmol/L — AB (ref 135–145)

## 2017-03-27 MED ORDER — MAGNESIUM OXIDE 400 (241.3 MG) MG PO TABS: 400.0000 mg | ORAL_TABLET | Freq: Two times a day (BID) | ORAL | 0 refills | Status: AC

## 2017-03-27 MED ORDER — IOPAMIDOL (ISOVUE-370) INJECTION 76%
100.0000 mL | Freq: Once | INTRAVENOUS | Status: AC | PRN
Start: 2017-03-27 — End: 2017-03-27
  Administered 2017-03-27: 75 mL via INTRAVENOUS

## 2017-03-27 MED ORDER — ALBUTEROL SULFATE (2.5 MG/3ML) 0.083% IN NEBU
5.0000 mg | INHALATION_SOLUTION | Freq: Once | RESPIRATORY_TRACT | Status: AC
  Administered 2017-03-27: 5 mg via RESPIRATORY_TRACT
  Filled 2017-03-27: qty 6

## 2017-03-27 MED ORDER — IPRATROPIUM-ALBUTEROL 0.5-2.5 (3) MG/3ML IN SOLN
3.0000 mL | Freq: Once | RESPIRATORY_TRACT | Status: AC
  Administered 2017-03-27: 3 mL via RESPIRATORY_TRACT
  Filled 2017-03-27: qty 3

## 2017-03-27 MED ORDER — LACTATED RINGERS IV BOLUS (SEPSIS)
1000.0000 mL | Freq: Once | INTRAVENOUS | Status: AC
  Administered 2017-03-27: 1000 mL via INTRAVENOUS

## 2017-03-27 MED ORDER — DEXAMETHASONE 2 MG PO TABS: 10.0000 mg | ORAL_TABLET | Freq: Once | ORAL | 0 refills | Status: AC

## 2017-03-27 MED ORDER — MAGNESIUM SULFATE 2 GM/50ML IV SOLN
2.0000 g | Freq: Once | INTRAVENOUS | Status: AC
  Administered 2017-03-27: 2 g via INTRAVENOUS
  Filled 2017-03-27: qty 50

## 2017-03-27 MED ORDER — POTASSIUM CHLORIDE CRYS ER 20 MEQ PO TBCR: 40.0000 meq | EXTENDED_RELEASE_TABLET | Freq: Two times a day (BID) | ORAL | 0 refills | Status: DC

## 2017-03-27 MED ORDER — IOPAMIDOL (ISOVUE-370) INJECTION 76%
INTRAVENOUS | Status: AC
  Filled 2017-03-27: qty 100

## 2017-03-27 MED ORDER — PREDNISONE 20 MG PO TABS: 60.0000 mg | ORAL_TABLET | Freq: Every day | ORAL | 0 refills | Status: AC

## 2017-03-27 MED ORDER — POTASSIUM CHLORIDE CRYS ER 20 MEQ PO TBCR
40.0000 meq | EXTENDED_RELEASE_TABLET | Freq: Once | ORAL | Status: AC
  Administered 2017-03-27: 40 meq via ORAL
  Filled 2017-03-27: qty 2

## 2017-03-27 MED ORDER — DEXAMETHASONE SODIUM PHOSPHATE 10 MG/ML IJ SOLN
10.0000 mg | Freq: Once | INTRAMUSCULAR | Status: AC
  Administered 2017-03-27: 10 mg via INTRAVENOUS
  Filled 2017-03-27: qty 1

## 2017-03-27 MED ORDER — HYDROCOD POLST-CPM POLST ER 10-8 MG/5ML PO SUER
5.0000 mL | Freq: Two times a day (BID) | ORAL | 0 refills | Status: DC | PRN
Start: 2017-03-27 — End: 2021-06-20

## 2017-03-27 MED ORDER — SODIUM CHLORIDE 0.9 % IV BOLUS (SEPSIS)
1000.0000 mL | Freq: Once | INTRAVENOUS | Status: AC
  Administered 2017-03-27: 1000 mL via INTRAVENOUS

## 2017-03-27 NOTE — Discharge Instructions (Signed)
Use your albuterol nebulizer every 4-6 hours for the next 24-48 hours, then as needed for wheezing  Try to drink as much fluid as possible, eat a balanced diet, and get plenty of rest for the next several days  If you tolerated the dose of steroids we gave you today, I have given you a second dose of dexamethasone.  This can be taken in 2-3 days.   Your CT scan showed a small nodule. The read is as below. Make sure your PCP is aware of this:  Ill-defined 5 by 4 mm right lower lobe pulmonary nodule medially  on image 50/7. No follow-up needed if patient is low-risk.  Non-contrast chest CT can be considered in 12 months if patient is  high-risk. This recommendation follows the consensus statement:  Guidelines for Management of Incidental Pulmonary Nodules Detected  on CT Images: From the Fleischner Society 2017; Radiology 2017;  284:228-243.

## 2017-03-27 NOTE — ED Provider Notes (Signed)
[facility]-EMERGENCY DEPT Provider Note   CSN: [id number] Arrival date & time: 03/27/17  1238     History   Chief Complaint Chief Complaint  Patient presents with  . Shortness of Breath    HPI Regina Combs is a 66 y.o. female.  HPI   66 year old female with past medical history as below who presents with persistent cough and shortness of breath.  The patient states that her symptoms started approximately 2 months ago.  She was exposed to fumes in a house and since then has had dry, wheezing cough.  She has had associated shortness of breath.  She has been seen by pulmonology, and has been started on Advair.  This does seem to improve her symptoms, as well as breathing treatments.  She has not been on steroids, she reportedly does not tolerate the steroid pills.  Over the last 24 hours, her shortness of breath is worsened with diffuse wheezing.  She was given a breathing treatment in triage and reports some dramatic improvement with this.  The symptoms do not seem to be improving.  She denies any associated chest pain.  Denies any fevers or chills.  Past Medical History:  Diagnosis Date  . Arthritis    left knee osteoarthritis.  . Asthma    no recent issues since a yr ago-Uses Inhalers as needed.  Marland Kitchen GERD (gastroesophageal reflux disease)    omeprazole helps.  . Hypertension   . PONV (postoperative nausea and vomiting)    states scopalamine patches helped N and V in past  . Poor venous access    "very difficult to get IV's"  . Status post implantation of urinary electronic stimulator device    right buttocks -remains inplace- Dr.Frank Mahalia Longest [id number] Urology ,[facility]  . Stroke [facility])    9'12 left sides weakness [facility] Stay of x5 days.    Patient Active Problem List   Diagnosis Date Noted  . 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 S/P knee replacement 05/22/2016    Past Surgical History:  Procedure Laterality Date  . ABDOMINAL  HYSTERECTOMY    . CHOLECYSTECTOMY     Laparoscopic - [facility], [place] , [place]  . TONSILLECTOMY    . TOTAL KNEE ARTHROPLASTY Left 05/22/2016   Procedure: LEFT TOTAL KNEE ARTHROPLASTY;  Surgeon: Durene Romans, MD;  Location: [facility] ORS;  Service: Orthopedics;  Laterality: Left;  . Urinary Stimulator Right    buttocks-remains -has remote device.    OB History    No data available       Home Medications    Prior to Admission medications   Medication Sig Start Date End Date Taking? Authorizing Provider  albuterol (PROVENTIL) (2.5 MG/3ML) 0.083% nebulizer solution Take 2.5 mg by nebulization every 6 (six) hours as needed for wheezing or shortness of breath.  03/25/17  Yes [provider]  amLODipine-olmesartan (AZOR) 5-20 MG tablet Take 1 tablet by mouth daily   Yes [provider]  aspirin EC 81 MG tablet Take 81 mg by mouth daily.   Yes [provider]  Fluticasone-Salmeterol (ADVAIR) 500-50 MCG/DOSE AEPB INL 1 PUFF PO BID 03/08/17  Yes [provider]  hydrochlorothiazide (HYDRODIURIL) 12.5 MG tablet Take 1 tablet by mouth daily   Yes [provider]  montelukast (SINGULAIR) 10 MG tablet Take 10 mg by mouth daily.   Yes [provider]  omeprazole (PRILOSEC) 20 MG capsule Take 20 mg by mouth daily before breakfast.   Yes [provider]  albuterol (  PROVENTIL HFA;VENTOLIN HFA) 108 (90 Base) MCG/ACT inhaler Inhale 2 puffs into the lungs every 6 (six) hours as needed.     [provider]  chlorpheniramine-HYDROcodone (TUSSIONEX PENNKINETIC ER) 10-8 MG/5ML SUER Take 5 mLs by mouth every 12 (twelve) hours as needed for cough. 03/27/17   Shaune Pollack, MD  dexamethasone (DECADRON) 2 MG tablet Take 5 tablets (10 mg total) by mouth once. 03/30/17 03/30/17  Shaune Pollack, MD  docusate sodium (COLACE) 100 MG capsule Take 1 capsule (100 mg total) by mouth 2 (two) times daily. 05/23/16   Lanney Gins, PA-C  ferrous sulfate 325 (65 FE)  MG tablet Take 1 tablet (325 mg total) by mouth 3 (three) times daily after meals. 05/23/16   Lanney Gins, PA-C  HYDROcodone-acetaminophen (NORCO) 7.5-325 MG tablet Take 1-2 tablets by mouth every 4 (four) hours as needed for moderate pain. 05/23/16   Lanney Gins, PA-C  magnesium oxide (MAG-OX) 400 (241.3 Mg) MG tablet Take 1 tablet (400 mg total) by mouth 2 (two) times daily. 03/27/17 03/30/17  Shaune Pollack, MD  methocarbamol (ROBAXIN) 500 MG tablet Take 1 tablet (500 mg total) by mouth every 6 (six) hours as needed for muscle spasms. 05/23/16   Lanney Gins, PA-C  polyethylene glycol (MIRALAX / GLYCOLAX) packet Take 17 g by mouth 2 (two) times daily. 05/23/16   Lanney Gins, PA-C  potassium chloride SA (K-DUR,KLOR-CON) 20 MEQ tablet Take 2 tablets (40 mEq total) by mouth 2 (two) times daily. 03/27/17 03/30/17  Shaune Pollack, MD  predniSONE (DELTASONE) 20 MG tablet Take 3 tablets (60 mg total) by mouth daily. 03/27/17 04/01/17  Shaune Pollack, MD    Family History No family history on file.  Social History Social History  Substance Use Topics  . Smoking status: Never Smoker  . Smokeless tobacco: Never Used  . Alcohol use Yes     Comment: social -rare     Allergies   Food; Gluten meal; and Sulfa antibiotics   Review of Systems Review of Systems  Constitutional: Positive for fatigue.  Respiratory: Positive for cough, shortness of breath and wheezing.   All other systems reviewed and are negative.    Physical Exam Updated Vital Signs BP 118/64   Pulse 84   Temp 98.2 F (36.8 C) (Oral)   Resp 18   SpO2 94%   Physical Exam  Constitutional: She is oriented to person, place, and time. She appears well-developed and well-nourished. No distress.  HENT:  Head: Normocephalic and atraumatic.  Eyes: Conjunctivae are normal.  Neck: Neck supple.  Cardiovascular: Normal rate, regular rhythm and normal heart sounds.  Exam reveals no friction rub.   No murmur  heard. Pulmonary/Chest: Effort normal. Tachypnea noted. No respiratory distress. She has wheezes (mild, end-expiratory). She has no rales.  Abdominal: Soft. Bowel sounds are normal. She exhibits no distension.  Musculoskeletal: She exhibits no edema.  Neurological: She is alert and oriented to person, place, and time. She exhibits normal muscle tone.  Skin: Skin is warm. Capillary refill takes less than 2 seconds.  Psychiatric: She has a normal mood and affect.  Nursing note and vitals reviewed.    ED Treatments / Results  Labs (all labs ordered are listed, but only abnormal results are displayed) Labs Reviewed  CBC - Abnormal; Notable for the following:       Result Value   WBC 11.7 (*)    Hemoglobin 11.0 (*)    HCT 32.8 (*)    All other components within normal limits  BASIC METABOLIC PANEL - Abnormal; Notable for the following:    Sodium 133 (*)    Potassium 2.8 (*)    Chloride 96 (*)    Glucose, Bld 101 (*)    Creatinine, Ser 1.16 (*)    GFR calc non Af Amer 49 (*)    GFR calc Af Amer 56 (*)    All other components within normal limits  I-STAT TROPONIN, ED    EKG  EKG Interpretation  Date/Time:  Wednesday March 27 2017 13:19:01 EDT Ventricular Rate:  105 PR Interval:    QRS Duration: 82 QT Interval:  337 QTC Calculation: 446 R Axis:   52 Text Interpretation:  Sinus tachycardia Low voltage, precordial leads Nonspecific T abnormalities, lateral leads No old tracing to compare Confirmed by Shaune Pollack [id number]) on 03/27/2017 6:54:36 PM       Radiology Dg Chest 2 View  Result Date: 03/27/2017 CLINICAL DATA:  Shortness of breath and wheezing.  Chest tightness EXAM: CHEST  2 VIEW COMPARISON:  February 14, 2017 FINDINGS: Lungs are clear. Heart size and pulmonary vascularity are normal. No adenopathy. There is degenerative change in the thoracic spine. IMPRESSION: No edema or consolidation. Electronically Signed   By: Bretta Bang III M.D.   On: 03/27/2017  14:15   Ct Angio Chest Pe W And/or Wo Contrast  Result Date: 03/27/2017 CLINICAL DATA:  Cough and shortness of breath. Bronchitis worsening since last Friday. Possible inhalational injury of pain seems last week. History of asthma. EXAM: CT ANGIOGRAPHY CHEST WITH CONTRAST TECHNIQUE: Multidetector CT imaging of the chest was performed using the standard protocol during bolus administration of intravenous contrast. Multiplanar CT image reconstructions and MIPs were obtained to evaluate the vascular anatomy. CONTRAST:  75 cc Isovue 370 COMPARISON:  Chest radiograph 03/27/2017 FINDINGS: Cardiovascular: No filling defect is identified in the pulmonary arterial tree to suggest pulmonary embolus. Atherosclerotic calcification of the aortic arch. Borderline cardiomegaly. Mediastinum/Nodes: No pathologic thoracic adenopathy. Lungs/Pleura: Ill-defined 5 by 4 mm right lower lobe nodule medially on image 50/7. No appreciable airway thickening. Upper Abdomen: Unremarkable Musculoskeletal: Lower cervical and thoracic spondylosis. Review of the MIP images confirms the above findings. IMPRESSION: 1. No filling defect is identified in the pulmonary arterial tree to suggest pulmonary embolus. 2. Ill-defined 5 by 4 mm right lower lobe pulmonary nodule medially on image 50/7. No follow-up needed if patient is low-risk. Non-contrast chest CT can be considered in 12 months if patient is high-risk. This recommendation follows the consensus statement: Guidelines for Management of Incidental Pulmonary Nodules Detected on CT Images: From the Fleischner Society 2017; Radiology 2017; 284:228-243. 3. Thoracic and lower cervical spondylosis. 4.  Aortic Atherosclerosis (ICD10-I70.0). Electronically Signed   By: Gaylyn Rong M.D.   On: 03/27/2017 20:59    Procedures Procedures (including critical care time)  Medications Ordered in ED Medications  albuterol (PROVENTIL) (2.5 MG/3ML) 0.083% nebulizer solution 5 mg (5 mg  Nebulization Given 03/27/17 1329)  ipratropium-albuterol (DUONEB) 0.5-2.5 (3) MG/3ML nebulizer solution 3 mL (3 mLs Nebulization Given 03/27/17 1950)  sodium chloride 0.9 % bolus 1,000 mL (0 mLs Intravenous Stopped 03/27/17 1953)  dexamethasone (DECADRON) injection 10 mg (10 mg Intravenous Given 03/27/17 1853)  lactated ringers bolus 1,000 mL (0 mLs Intravenous Stopped 03/27/17 2239)  potassium chloride SA (K-DUR,KLOR-CON) CR tablet 40 mEq (40 mEq Oral Given 03/27/17 2012)  magnesium sulfate IVPB 2 g 50 mL (0 g Intravenous Stopped 03/27/17 2113)  iopamidol (ISOVUE-370) 76 % injection 100 mL (75 mLs Intravenous Contrast Given  03/27/17 2030)  potassium chloride SA (K-DUR,KLOR-CON) CR tablet 40 mEq (40 mEq Oral Given 03/27/17 2239)     Initial Impression / Assessment and Plan / ED Course  I have reviewed the triage vital signs and the nursing notes.  Pertinent labs & imaging results that were available during my care of the patient were reviewed by me and considered in my medical decision making (see chart for details).     66 year old female here with several months of shortness of breath and wheezing.  Patient has been using albuterol but has not trialed steroids.  She has finished multiple courses of antibiotics.  On exam here, she has frequent coughing, mild wheezing but normal work of breathing.  Lab work shows hypokalemia and likely dehydration.  CT angios obtained given persistent cough, mild pleuritic component, and shortness of breath, which shows no evidence of acute abnormality.  There is no PE.  She has a small nodule that can be followed up as an outpatient.  Patient was given potassium, magnesium, and breathing treatments here with significant improvement.  I suspect patient has prolonged post viral cough with component of asthma exacerbation.  We discussed risks and benefits of steroids.  She has not tolerated prednisone well.  Will give her a dose of Decadron here, and a single dose  to repeat in 3 days if she tolerates it without difficulty.  Otherwise, her elytes have been replaced, she is well-appearing and satting well on room air, and will discharge home with continued pulmonology follow-up.  Final Clinical Impressions(s) / ED Diagnoses   Final diagnoses:  Hypokalemia  Dehydration  Wheezing-associated respiratory infection (WARI)  Pulmonary nodule    New Prescriptions Discharge Medication List as of 03/27/2017 10:31 PM    START taking these medications   Details  dexamethasone (DECADRON) 2 MG tablet Take 5 tablets (10 mg total) by mouth once., Starting Sat 03/30/2017, Print    magnesium oxide (MAG-OX) 400 (241.3 Mg) MG tablet Take 1 tablet (400 mg total) by mouth 2 (two) times daily., Starting Wed 03/27/2017, Until Sat 03/30/2017, Print    potassium chloride SA (K-DUR,KLOR-CON) 20 MEQ tablet Take 2 tablets (40 mEq total) by mouth 2 (two) times daily., Starting Wed 03/27/2017, Until Sat 03/30/2017, Print    predniSONE (DELTASONE) 20 MG tablet Take 3 tablets (60 mg total) by mouth daily., Starting Wed 03/27/2017, Until Mon 04/01/2017, Print         Shaune PollackIsaacs, Amyra Vantuyl, MD 03/28/17 [phone]

## 2017-03-27 NOTE — ED Triage Notes (Signed)
Pt complains of cough, shortness of breath. Pt states she has had bronchitis for the past 2 months, worse since last Friday. Pt states her symptoms became worse when she inhaled paint fumes last week. Pt saw pulmonologist 2 weeks ago, started on nebulizer treatments,  which she states helped initially. Pt has also been taking Advair since September. Pt states she had similar episode 2 years ago.

## 2017-04-02 ENCOUNTER — Other Ambulatory Visit: Payer: Medicare HMO

## 2017-04-02 ENCOUNTER — Ambulatory Visit: Payer: Medicare HMO

## 2017-04-25 ENCOUNTER — Ambulatory Visit
Admission: RE | Admit: 2017-04-25 | Discharge: 2017-04-25 | Disposition: A | Discharge status: Home / Self Care | Payer: Medicare HMO | Source: Ambulatory Visit | Attending: Medical | Admitting: Medical

## 2017-04-25 DIAGNOSIS — Z1231 Encounter for screening mammogram for malignant neoplasm of breast: Secondary | ICD-10-CM

## 2017-04-25 DIAGNOSIS — M8580 Other specified disorders of bone density and structure, unspecified site: Secondary | ICD-10-CM

## 2017-12-12 ENCOUNTER — Other Ambulatory Visit: Payer: Self-pay | Admitting: Nephrology

## 2017-12-12 DIAGNOSIS — N183 Chronic kidney disease, stage 3 unspecified: Secondary | ICD-10-CM

## 2018-05-14 ENCOUNTER — Other Ambulatory Visit: Payer: Self-pay | Admitting: Physician Assistant

## 2018-05-14 DIAGNOSIS — Z1231 Encounter for screening mammogram for malignant neoplasm of breast: Secondary | ICD-10-CM

## 2018-06-19 ENCOUNTER — Ambulatory Visit
Admission: RE | Admit: 2018-06-19 | Discharge: 2018-06-19 | Disposition: A | Discharge status: Home / Self Care | Payer: Medicare HMO | Source: Ambulatory Visit | Attending: Physician Assistant | Admitting: Physician Assistant

## 2018-06-19 DIAGNOSIS — Z1231 Encounter for screening mammogram for malignant neoplasm of breast: Secondary | ICD-10-CM

## 2018-07-16 ENCOUNTER — Other Ambulatory Visit: Payer: Self-pay | Admitting: Sports Medicine

## 2018-07-16 DIAGNOSIS — M25552 Pain in left hip: Secondary | ICD-10-CM

## 2018-07-28 ENCOUNTER — Ambulatory Visit
Admission: RE | Admit: 2018-07-28 | Discharge: 2018-07-28 | Disposition: A | Discharge status: Home / Self Care | Payer: Medicare HMO | Source: Ambulatory Visit | Attending: Sports Medicine | Admitting: Sports Medicine

## 2018-07-28 DIAGNOSIS — M25552 Pain in left hip: Secondary | ICD-10-CM

## 2018-09-16 ENCOUNTER — Other Ambulatory Visit: Payer: Self-pay | Admitting: Sports Medicine

## 2018-09-16 DIAGNOSIS — M25559 Pain in unspecified hip: Secondary | ICD-10-CM

## 2018-11-20 ENCOUNTER — Ambulatory Visit
Admission: RE | Admit: 2018-11-20 | Discharge: 2018-11-20 | Disposition: A | Discharge status: Home / Self Care | Payer: Medicare HMO | Source: Ambulatory Visit | Attending: Sports Medicine | Admitting: Sports Medicine

## 2018-11-20 DIAGNOSIS — M25559 Pain in unspecified hip: Secondary | ICD-10-CM

## 2019-04-14 ENCOUNTER — Other Ambulatory Visit: Payer: Self-pay | Admitting: Physician Assistant

## 2019-04-14 DIAGNOSIS — Z1231 Encounter for screening mammogram for malignant neoplasm of breast: Secondary | ICD-10-CM

## 2019-04-14 DIAGNOSIS — E2839 Other primary ovarian failure: Secondary | ICD-10-CM

## 2019-06-23 ENCOUNTER — Ambulatory Visit: Payer: Medicare HMO

## 2019-07-01 ENCOUNTER — Ambulatory Visit
Admission: RE | Admit: 2019-07-01 | Discharge: 2019-07-01 | Disposition: A | Discharge status: Home / Self Care | Payer: Medicare HMO | Source: Ambulatory Visit | Attending: Physician Assistant | Admitting: Physician Assistant

## 2019-07-01 ENCOUNTER — Other Ambulatory Visit: Payer: Self-pay

## 2019-07-01 DIAGNOSIS — E2839 Other primary ovarian failure: Secondary | ICD-10-CM

## 2019-07-01 DIAGNOSIS — Z1231 Encounter for screening mammogram for malignant neoplasm of breast: Secondary | ICD-10-CM

## 2019-07-02 ENCOUNTER — Ambulatory Visit: Payer: Medicare HMO | Attending: Internal Medicine

## 2019-07-02 DIAGNOSIS — Z23 Encounter for immunization: Secondary | ICD-10-CM

## 2019-07-02 NOTE — Progress Notes (Signed)
   Covid-19 Vaccination Clinic  Name:  Regina Combs    MRN: [id number] DOB: 17-Mar-1951  07/02/2019  Regina Combs was observed post Covid-19 immunization for 15 minutes without incidence. She was provided with Vaccine Information Sheet and instruction to access the V-Safe system.   Regina Combs was instructed to call 911 with any severe reactions post vaccine: Marland Kitchen Difficulty breathing  . Swelling of your face and throat  . A fast heartbeat  . A bad rash all over your body  . Dizziness and weakness    Immunizations Administered    Name Date Dose VIS Date Route   Pfizer COVID-19 Vaccine 07/02/2019  3:24 PM 0.3 mL 05/08/2019 Intramuscular   Manufacturer: [employer], [employer]   Lot: [id number]   NDC: [address]-9

## 2019-07-10 ENCOUNTER — Ambulatory Visit: Payer: Medicare HMO

## 2019-07-28 ENCOUNTER — Ambulatory Visit: Payer: Medicare HMO | Attending: Internal Medicine

## 2019-07-28 DIAGNOSIS — Z23 Encounter for immunization: Secondary | ICD-10-CM

## 2019-07-28 NOTE — Progress Notes (Signed)
   Covid-19 Vaccination Clinic  Name:  Regina Combs    MRN: [id number] DOB: 1951-04-14  07/28/2019  [name] was observed post Covid-19 immunization for 15 minutes without incident. She was provided with Vaccine Information Sheet and instruction to access the V-Safe system.   [name] was instructed to call 911 with any severe reactions post vaccine: Marland Kitchen Difficulty breathing  . Swelling of face and throat  . A fast heartbeat  . A bad rash all over body  . Dizziness and weakness   Immunizations Administered    Name Date Dose VIS Date Route   Pfizer COVID-19 Vaccine 07/28/2019 10:29 AM 0.3 mL 05/08/2019 Intramuscular   Manufacturer: [employer], [employer]   Lot: [id number]   NDC: [address]-4

## 2019-09-04 ENCOUNTER — Other Ambulatory Visit: Payer: Self-pay

## 2019-09-04 ENCOUNTER — Encounter (HOSPITAL_COMMUNITY): Payer: Self-pay | Admitting: *Deleted

## 2019-09-04 ENCOUNTER — Emergency Department (HOSPITAL_COMMUNITY): Payer: Medicare HMO

## 2019-09-04 ENCOUNTER — Emergency Department (HOSPITAL_COMMUNITY)
Admission: EM | Admit: 2019-09-04 | Discharge: 2019-09-04 | Disposition: A | Discharge status: Home / Self Care | Payer: Medicare HMO | Attending: Emergency Medicine | Admitting: Emergency Medicine

## 2019-09-04 DIAGNOSIS — I10 Essential (primary) hypertension: Secondary | ICD-10-CM | POA: Diagnosis not present

## 2019-09-04 DIAGNOSIS — Z96653 Presence of artificial knee joint, bilateral: Secondary | ICD-10-CM | POA: Insufficient documentation

## 2019-09-04 DIAGNOSIS — Z7982 Long term (current) use of aspirin: Secondary | ICD-10-CM | POA: Diagnosis not present

## 2019-09-04 DIAGNOSIS — Z79899 Other long term (current) drug therapy: Secondary | ICD-10-CM | POA: Diagnosis not present

## 2019-09-04 DIAGNOSIS — K625 Hemorrhage of anus and rectum: Secondary | ICD-10-CM | POA: Diagnosis present

## 2019-09-04 DIAGNOSIS — J45909 Unspecified asthma, uncomplicated: Secondary | ICD-10-CM | POA: Diagnosis not present

## 2019-09-04 DIAGNOSIS — K5733 Diverticulitis of large intestine without perforation or abscess with bleeding: Secondary | ICD-10-CM | POA: Diagnosis not present

## 2019-09-04 LAB — CBC WITH DIFFERENTIAL/PLATELET
Abs Immature Granulocytes: 0.03 10*3/uL (ref 0.00–0.07)
Basophils Absolute: 0.1 10*3/uL (ref 0.0–0.1)
Basophils Relative: 1 %
Eosinophils Absolute: 0.2 10*3/uL (ref 0.0–0.5)
Eosinophils Relative: 2 %
HCT: 41.6 % (ref 36.0–46.0)
Hemoglobin: 13.4 g/dL (ref 12.0–15.0)
Immature Granulocytes: 0 %
Lymphocytes Relative: 22 %
Lymphs Abs: 2.2 10*3/uL (ref 0.7–4.0)
MCH: 28 pg (ref 26.0–34.0)
MCHC: 32.2 g/dL (ref 30.0–36.0)
MCV: 86.8 fL (ref 80.0–100.0)
Monocytes Absolute: 0.8 10*3/uL (ref 0.1–1.0)
Monocytes Relative: 8 %
Neutro Abs: 6.9 10*3/uL (ref 1.7–7.7)
Neutrophils Relative %: 67 %
Platelets: 354 10*3/uL (ref 150–400)
RBC: 4.79 MIL/uL (ref 3.87–5.11)
RDW: 14 % (ref 11.5–15.5)
WBC: 10.2 10*3/uL (ref 4.0–10.5)
nRBC: 0 % (ref 0.0–0.2)

## 2019-09-04 LAB — BASIC METABOLIC PANEL
Anion gap: 9 (ref 5–15)
BUN: 11 mg/dL (ref 8–23)
CO2: 24 mmol/L (ref 22–32)
Calcium: 9.4 mg/dL (ref 8.9–10.3)
Chloride: 107 mmol/L (ref 98–111)
Creatinine, Ser: 0.85 mg/dL (ref 0.44–1.00)
GFR calc Af Amer: >60 mL/min (ref >60)
GFR calc non Af Amer: >60 mL/min (ref >60)
Glucose, Bld: 87 mg/dL (ref 70–99)
Potassium: 3.9 mmol/L (ref 3.5–5.1)
Sodium: 140 mmol/L (ref 135–145)

## 2019-09-04 LAB — POC OCCULT BLOOD, ED: Fecal Occult Bld: POSITIVE — AB

## 2019-09-04 LAB — LACTIC ACID, PLASMA: Lactic Acid, Venous: 0.6 mmol/L (ref 0.5–1.9)

## 2019-09-04 MED ORDER — SODIUM CHLORIDE (PF) 0.9 % IJ SOLN
INTRAMUSCULAR | Status: AC
  Filled 2019-09-04: qty 50

## 2019-09-04 MED ORDER — IOHEXOL 300 MG/ML  SOLN
100.0000 mL | Freq: Once | INTRAMUSCULAR | Status: AC | PRN
  Administered 2019-09-04: 19:00:00 100 mL via INTRAVENOUS

## 2019-09-04 MED ORDER — METRONIDAZOLE 500 MG PO TABS
500.0000 mg | ORAL_TABLET | Freq: Once | ORAL | Status: AC
  Administered 2019-09-04: 21:00:00 500 mg via ORAL
  Filled 2019-09-04: qty 1

## 2019-09-04 MED ORDER — CIPROFLOXACIN HCL 500 MG PO TABS
500.0000 mg | ORAL_TABLET | Freq: Once | ORAL | Status: AC
  Administered 2019-09-04: 21:00:00 500 mg via ORAL
  Filled 2019-09-04: qty 1

## 2019-09-04 MED ORDER — CIPROFLOXACIN HCL 500 MG PO TABS: 500.0000 mg | ORAL_TABLET | Freq: Two times a day (BID) | ORAL | 0 refills | Status: DC

## 2019-09-04 MED ORDER — METRONIDAZOLE 500 MG PO TABS: 500.0000 mg | ORAL_TABLET | Freq: Two times a day (BID) | ORAL | 0 refills | Status: DC

## 2019-09-04 NOTE — ED Triage Notes (Signed)
Yesterday a little diarrhea, this morning bright red blood in toilet, blood on tissue but none after wiping.

## 2019-09-04 NOTE — ED Notes (Signed)
Pt transported to CT ?

## 2019-09-04 NOTE — ED Provider Notes (Signed)
[place] DEPT Provider Note   CSN: [id number] Arrival date & time: 09/04/19  9030     History Chief Complaint  Patient presents with  . Rectal Bleeding    Regina Combs is a 69 y.o. female.  HPI She presents for evaluation of an episode of rectal bleeding which occurred this morning.  This was preceded by diarrhea, yesterday.  She denies fever, vomiting, weakness or dizziness.  She has a history of colon polyps, but never had a biopsy that she can recall.  She recalls her last colonoscopy was in 2014 at that time it was normal.  She does not recall polyps being discussed at this time.  No other recent illnesses.  There are no other known modifying factors.    Past Medical History:  Diagnosis Date  . Arthritis    left knee osteoarthritis.  . Asthma    no recent issues since a yr ago-Uses Inhalers as needed.  Marland Kitchen GERD (gastroesophageal reflux disease)    omeprazole helps.  . Hypertension   . PONV (postoperative nausea and vomiting)    states scopalamine patches helped N and V in past  . Poor venous access    "very difficult to get IV's"  . Status post implantation of urinary electronic stimulator device    right buttocks -remains inplace- Dr.Frank Lewie Loron [id number] Urology ,[facility]  . Stroke [facility])    9'12 left sides weakness [facility] Stay of x5 days.    Patient Active Problem List   Diagnosis Date Noted  . 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 S/P knee replacement 05/22/2016    Past Surgical History:  Procedure Laterality Date  . ABDOMINAL HYSTERECTOMY    . CHOLECYSTECTOMY     Laparoscopic - [facility], [place] , [place]  . TONSILLECTOMY    . TOTAL KNEE ARTHROPLASTY Left 05/22/2016   Procedure: LEFT TOTAL KNEE ARTHROPLASTY;  Surgeon: Paralee Cancel, MD;  Location: [facility] ORS;  Service: Orthopedics;  Laterality: Left;  . Urinary Stimulator Right    buttocks-remains -has remote device.     OB History   No obstetric history on file.      Family History  Problem Relation Age of Onset  . Breast cancer Mother 53    Social History   Tobacco Use  . Smoking status: Never Smoker  . Smokeless tobacco: Never Used  Substance Use Topics  . Alcohol use: Yes    Comment: social -rare  . Drug use: No    Home Medications Prior to Admission medications   Medication Sig Start Date End Date Taking? Authorizing Provider  acetaminophen (TYLENOL) 500 MG tablet Take 500 mg by mouth every 6 (six) hours as needed for mild pain.   Yes [provider]  albuterol (PROVENTIL HFA;VENTOLIN HFA) 108 (90 Base) MCG/ACT inhaler Inhale 2 puffs into the lungs every 6 (six) hours as needed.    Yes [provider]  albuterol (PROVENTIL) (2.5 MG/3ML) 0.083% nebulizer solution Take 2.5 mg by nebulization every 6 (six) hours as needed for wheezing or shortness of breath.  03/25/17  Yes [provider]  amLODipine (NORVASC) 10 MG tablet Take 10 mg by mouth daily. 07/07/19  Yes [provider]  aspirin EC 81 MG tablet Take 81 mg by mouth daily.   Yes [provider]  famotidine (PEPCID) 20 MG tablet Take 20 mg by mouth 2 (two) times daily. 07/07/19  Yes [provider]  ibuprofen (ADVIL) 200 MG tablet Take 200 mg by mouth every 6 (  six) hours as needed for moderate pain.   Yes [provider]  losartan (COZAAR) 100 MG tablet Take 100 mg by mouth daily. 07/09/19  Yes [provider]  zolpidem (AMBIEN) 5 MG tablet Take 5 mg by mouth at bedtime as needed for sleep. 08/12/19  Yes [provider]  chlorpheniramine-HYDROcodone (TUSSIONEX PENNKINETIC ER) 10-8 MG/5ML SUER Take 5 mLs by mouth every 12 (twelve) hours as needed for cough. Patient not taking: Reported on 09/04/2019 03/27/17   Duffy Bruce, MD  ciprofloxacin (CIPRO) 500 MG tablet Take 1 tablet (500 mg total) by mouth 2 (two) times daily. One po bid x 7 days 09/04/19   Daleen Bo, MD  docusate sodium (COLACE) 100 MG capsule Take 1  capsule (100 mg total) by mouth 2 (two) times daily. Patient not taking: Reported on 09/04/2019 05/23/16   Danae Orleans, PA-C  ferrous sulfate 325 (65 FE) MG tablet Take 1 tablet (325 mg total) by mouth 3 (three) times daily after meals. Patient not taking: Reported on 09/04/2019 05/23/16   Danae Orleans, PA-C  HYDROcodone-acetaminophen (NORCO) 7.5-325 MG tablet Take 1-2 tablets by mouth every 4 (four) hours as needed for moderate pain. Patient not taking: Reported on 09/04/2019 05/23/16   Danae Orleans, PA-C  methocarbamol (ROBAXIN) 500 MG tablet Take 1 tablet (500 mg total) by mouth every 6 (six) hours as needed for muscle spasms. Patient not taking: Reported on 09/04/2019 05/23/16   Danae Orleans, PA-C  metroNIDAZOLE (FLAGYL) 500 MG tablet Take 1 tablet (500 mg total) by mouth 2 (two) times daily. One po bid x 7 days 09/04/19   Daleen Bo, MD  polyethylene glycol [facility] / Floria Raveling) packet Take 17 g by mouth 2 (two) times daily. Patient not taking: Reported on 09/04/2019 05/23/16   Danae Orleans, PA-C    Allergies    Food, Gluten meal, Sulfa antibiotics, Adhesive [tape], and Diclofenac  Review of Systems   Review of Systems  All other systems reviewed and are negative.   Physical Exam Updated Vital Signs BP (!) 153/71 (BP Location: Left Arm)   Pulse 70   Temp 98.7 F (37.1 C) (Oral)   Resp 15   Ht 5' 2.5" (1.588 m)   Wt 83.9 kg   SpO2 98%   BMI 33.30 kg/m   Physical Exam Vitals and nursing note reviewed.  Constitutional:      General: She is not in acute distress.    Appearance: She is well-developed. She is not ill-appearing, toxic-appearing or diaphoretic.  HENT:     Head: Normocephalic and atraumatic.     Right Ear: External ear normal.     Left Ear: External ear normal.  Eyes:     Conjunctiva/sclera: Conjunctivae normal.     Pupils: Pupils are equal, round, and reactive to light.  Neck:     Trachea: Phonation normal.  Cardiovascular:     Rate and Rhythm:  Normal rate and regular rhythm.     Heart sounds: Normal heart sounds.  Pulmonary:     Effort: Pulmonary effort is normal.     Breath sounds: Normal breath sounds.  Abdominal:     General: There is no distension.     Palpations: Abdomen is soft.     Tenderness: There is no abdominal tenderness. There is no guarding.  Genitourinary:    Comments: Normal anus, without evidence for hemorrhoids or anal fissure.  On digital rectal exam there is blood on the examining finger, and no palpable rectal mass.  No stool  on exam. Musculoskeletal:        General: Normal range of motion.     Cervical back: Normal range of motion and neck supple.  Skin:    General: Skin is warm and dry.  Neurological:     Mental Status: She is alert and oriented to person, place, and time.     Cranial Nerves: No cranial nerve deficit.     Sensory: No sensory deficit.     Motor: No abnormal muscle tone.     Coordination: Coordination normal.  Psychiatric:        Behavior: Behavior normal.        Thought Content: Thought content normal.        Judgment: Judgment normal.     ED Results / Procedures / Treatments   Labs (all labs ordered are listed, but only abnormal results are displayed) Labs Reviewed  POC OCCULT BLOOD, ED - Abnormal; Notable for the following components:      Result Value   Fecal Occult Bld POSITIVE (*)    All other components within normal limits  BASIC METABOLIC PANEL  CBC WITH DIFFERENTIAL/PLATELET  LACTIC ACID, PLASMA    EKG None  Radiology CT Abdomen Pelvis W Contrast  Addendum Date: 09/04/2019   ADDENDUM REPORT: 09/04/2019 19:35 ADDENDUM: These results were called by telephone at the time of interpretation on 09/04/2019 at 7:34 pm to provider [facility] , who verbally acknowledged these results. Electronically Signed   By: Zetta Bills M.D.   On: 09/04/2019 19:35   Result Date: 09/04/2019 CLINICAL DATA:  Abdominal abscess, infection suspected. Diarrhea with bright red blood.  EXAM: CT ABDOMEN AND PELVIS WITH CONTRAST TECHNIQUE: Multidetector CT imaging of the abdomen and pelvis was performed using the standard protocol following bolus administration of intravenous contrast. CONTRAST:  121m OMNIPAQUE IOHEXOL 300 MG/ML  SOLN COMPARISON:  No recent priors for comparison but there is a study from 07/28/2018 of the left hip. FINDINGS: Lower chest: Basilar atelectasis no consolidation or pleural effusion. Hepatobiliary: No focal, suspicious hepatic lesion. Post cholecystectomy without signs of ductal dilation. Pancreas: No suspicious pancreatic lesion. No ductal dilation or inflammation. Spleen: Spleen is normal size without focal lesion. Adrenals/Urinary Tract: Adrenal glands are normal. Symmetric renal enhancement without hydronephrosis. No focal renal lesion. Urinary bladder is normal. Stomach/Bowel: Small bowel is normal caliber without perienteric stranding. Appendix is not visualized. No secondary signs of pericecal. Segmental thickening of the sigmoid. Hepatic flexure with thickening more so than the sigmoid colon showing segmental thickening at a watershed point. Thickening of the sigmoid with bridging of the sigmoid to the left ovary likely sequela of prior diverticulitis potentially with mild superimposed diverticulitis. Vascular/Lymphatic: Scattered atherosclerosis. Patent abdominal vasculature. Patent SMV and portal vein. Reproductive: Post hysterectomy. No adnexal mass. Other: No signs of free air. No ascites. No upper abdominal or retroperitoneal adenopathy. No pelvic lymphadenopathy. Musculoskeletal: Sacral nerve stimulator with power pack over the right buttock. IMPRESSION: 1. Profound segmental thickening of the splenic flexure and proximal descending colon greater than other areas of the colon raising the question of watershed ischemia. Correlate with any risk factors such as hypotension and with lactate, findings are in an unusual distribution and long segment for garden  variety diverticulitis. Would also consider correlation with any risk factors for infectious colitis such as C difficile a in. 2. Signs of previous diverticulitis and perhaps current mild diverticulitis with scarring associated with the left ovary similar to CT of 07/28/2018. 3. Post cholecystectomy without signs of ductal dilation.  4. Aortic atherosclerosis. A call is out to the referring provider to further discuss findings in the above case. Aortic Atherosclerosis (ICD10-I70.0). Electronically Signed: By: Zetta Bills M.D. On: 09/04/2019 19:24    Procedures Procedures (including critical care time)  Medications Ordered in ED Medications  iohexol (OMNIPAQUE) 300 MG/ML solution 100 mL (100 mLs Intravenous Contrast Given 09/04/19 1848)  ciprofloxacin (CIPRO) tablet 500 mg (500 mg Oral Given 09/04/19 2108)  metroNIDAZOLE (FLAGYL) tablet 500 mg (500 mg Oral Given 09/04/19 2108)    ED Course  I have reviewed the triage vital signs and the nursing notes.  Pertinent labs & imaging results that were available during my care of the patient were reviewed by me and considered in my medical decision making (see chart for details).  Clinical Course as of Sep 04 1333  Fri Sep 04, 2019  1843 Normal  Basic metabolic panel [EW]  2694 Abnormal, blood present  POC occult blood, ED Provider will collect(!) [EW]  1844 Normal  CBC with Differential [EW]    Clinical Course User Index [EW] Daleen Bo, MD   MDM Rules/Calculators/A&P                       Patient Vitals for the past 24 hrs:  BP Temp Temp src Pulse Resp SpO2  09/04/19 2109 (!) 153/71 98.7 F (37.1 C) Oral 70 15 98 %  09/04/19 1930 (!) 153/71 - - 70 - 98 %  09/04/19 1900 (!) 153/73 - - 74 - 99 %  09/04/19 1837 (!) 152/75 - - 70 15 98 %    At discharge- reevaluation with update and discussion. After initial assessment and treatment, an updated evaluation reveals she is comfortable and has no further complaints. Daleen Bo    Medical Decision Making:  This patient is presenting for evaluation of rectal bleeding, which does require a range of treatment options, and is a complaint that involves a high risk of morbidity and mortality. The differential diagnoses include infectious processes, intestinal cancer, occult hemorrhoids. I decided  to review old records, and in summary relatively healthy with a history of GERD, hypertension, and urinary tract abnormalities. I did not require additional historical information from anyone else. Clinical Laboratory Tests Ordered, included CBC, c-Met. Radiologic Tests Ordered, included CT abdomen pelvis. I independently Visualized: CT images, which show thickened colon, mid distal  Critical Interventions-evaluation for rectal bleeding with bright red blood in the rectum.  After These Interventions, the Patient was reevaluated and was found stable, without hemodynamic compromise.  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  She is stable for discharge with outpatient follow-up with GI  CRITICAL CARE-no Performed by: Daleen Bo  Nursing Notes Reviewed/ Care Coordinated Applicable Imaging Reviewed Interpretation of Laboratory Data incorporated into ED treatment  The patient appears reasonably screened and/or stabilized for discharge and I doubt any other medical condition or other [facility] requiring further screening, evaluation, or treatment in the ED at this time prior to discharge.  Plan: Home Medications-continue usual medications; Home Treatments-low fiber diet; return here if the recommended treatment, does not improve the symptoms; Recommended follow up-GI follow-up 1 to 2 weeks and as needed  Final Clinical Impression(s) / ED Diagnoses Final diagnoses:  Diverticulitis of large intestine without perforation or abscess with bleeding    Rx / DC Orders ED Discharge Orders         Ordered    ciprofloxacin  (CIPRO) 500 MG  tablet  2 times daily     09/04/19 2055    metroNIDAZOLE (FLAGYL) 500 MG tablet  2 times daily     09/04/19 2055           Daleen Bo, MD 09/05/19 1339

## 2019-09-04 NOTE — Discharge Instructions (Signed)
Get plenty of rest and drink more fluids.  Stay on a low fiber diet for now.  Start the antibiotic prescriptions, tomorrow morning.  Call the GI doctor for a follow-up appointment to be seen for a checkup in 1 or 2 weeks.

## 2019-09-04 NOTE — ED Notes (Signed)
Pt provided with warm blanket and updated on plan of care. Pts call bell within reach '

## 2020-07-05 IMAGING — MG DIGITAL SCREENING BILAT W/ CAD
5 series · 5 of 5 positions shown · non-contrast
Comparison: Previous exam(s).

CLINICAL DATA: Screening.

EXAM:
DIGITAL SCREENING BILATERAL MAMMOGRAM WITH CAD

[R MLO (1 of 2)]
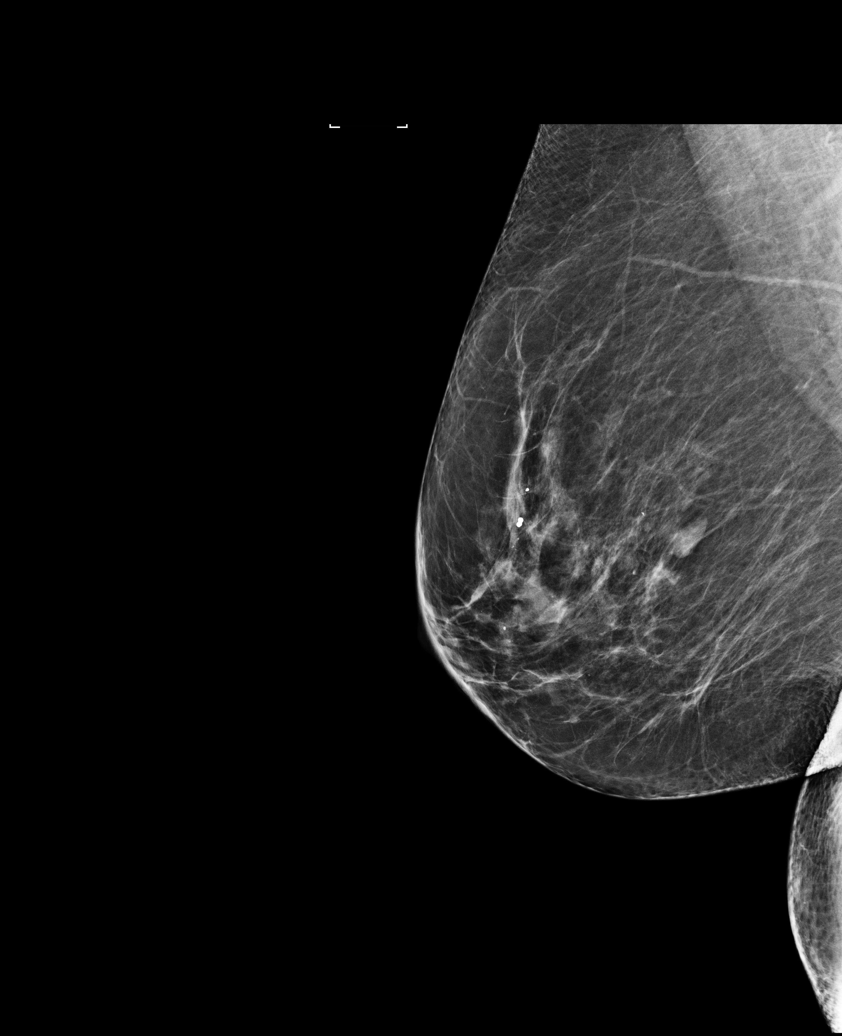

[L CC]
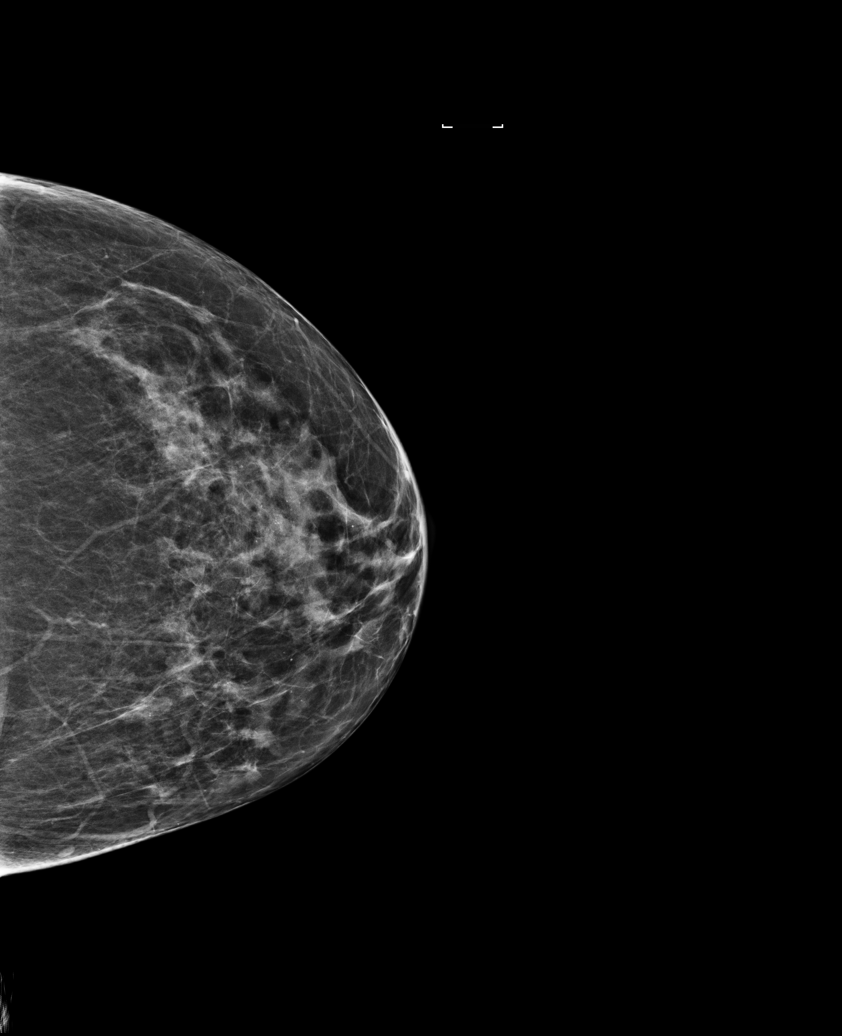

[R MLO (2 of 2)]
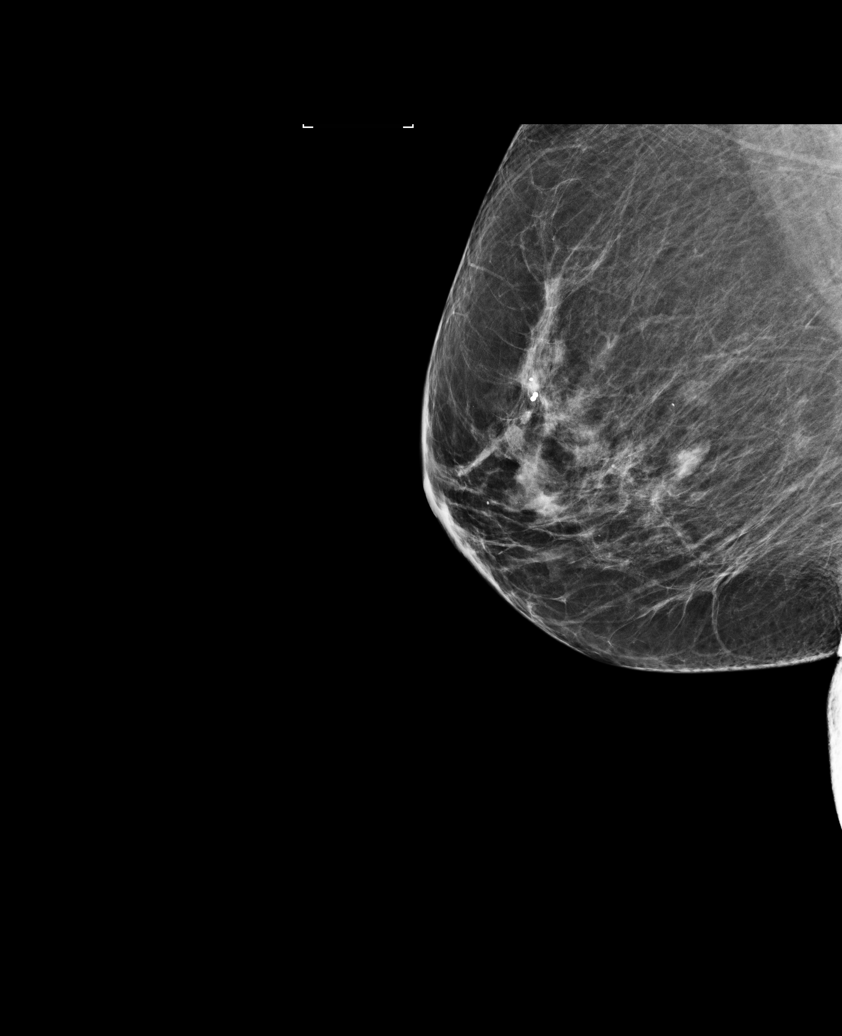

[R CC]
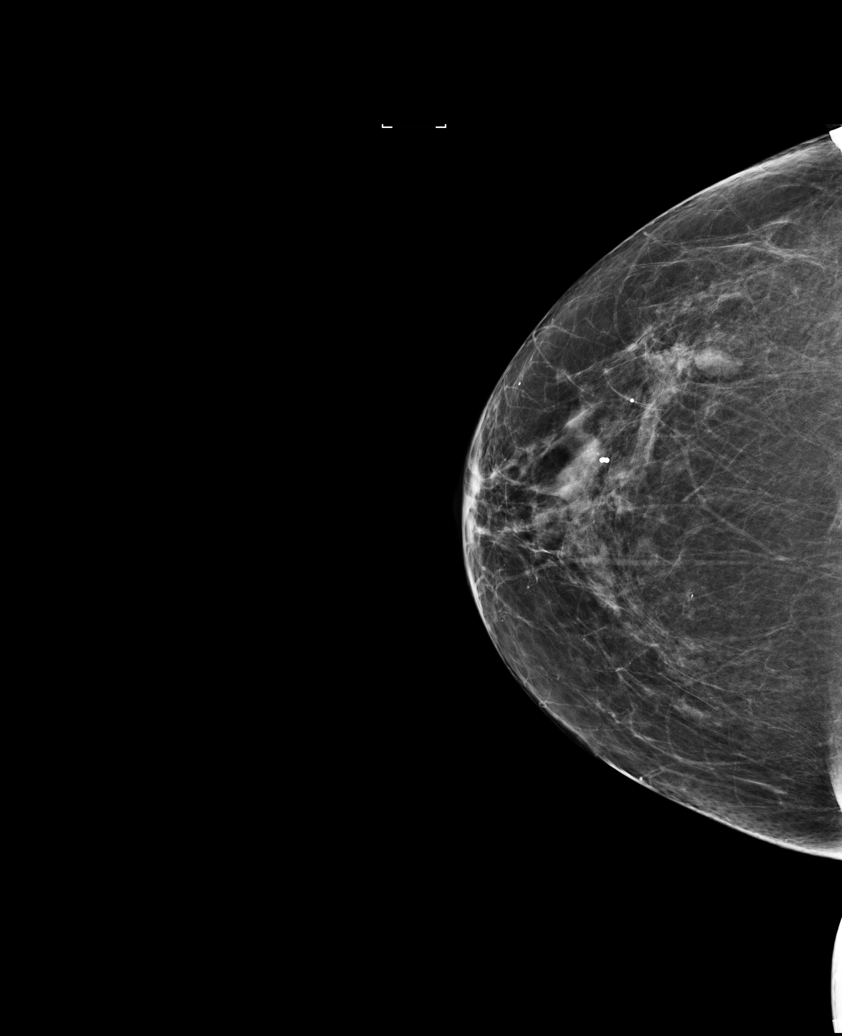

[L MLO]
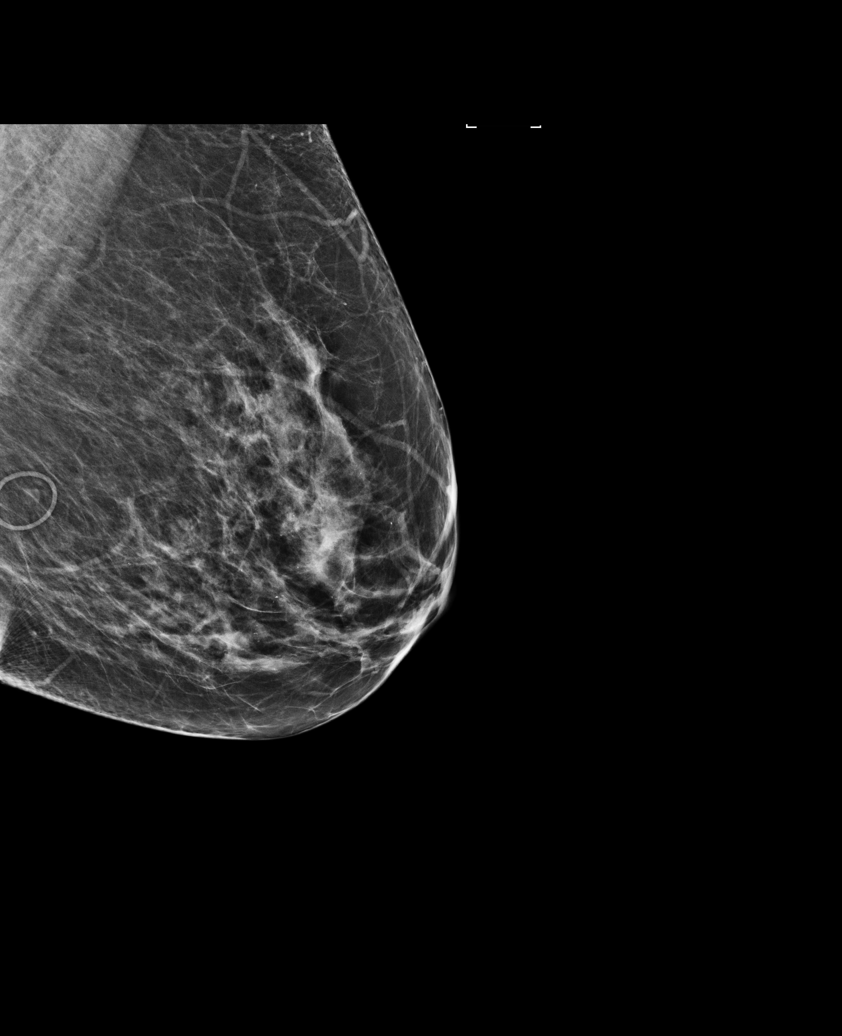

[5 of 5 positions shown; findings below may reference images not displayed]

ACR Breast Density Category c: The breast tissue is heterogeneously
dense, which may obscure small masses.
FINDINGS: There are no findings suspicious for malignancy. Images were
processed with CAD.
IMPRESSION: No mammographic evidence of malignancy. A result letter of this
screening mammogram will be mailed directly to the patient.

RECOMMENDATION:
Screening mammogram in one year. (Code:[id number]-[id number])

BI-RADS CATEGORY  1: Negative.

## 2020-07-29 ENCOUNTER — Other Ambulatory Visit: Payer: Self-pay | Admitting: Pulmonary Disease

## 2021-06-09 ENCOUNTER — Other Ambulatory Visit: Payer: Self-pay

## 2021-06-09 DIAGNOSIS — I872 Venous insufficiency (chronic) (peripheral): Secondary | ICD-10-CM

## 2021-06-19 NOTE — Progress Notes (Signed)
[facility] [place]  ASSESSMENT / PLAN: Regina Combs is a 71 y.o. female with chronic venous insufficiency of bilateral lower extremities (R>L) causing edema, discomfort, varicosities, reticular veins (C3 disease).  Venous duplex is significant for anterior accessory saphenous vein reflux. Recommend compression and elevation for symptomatic relief. Follow up with [place] in three months to discuss anterior accessory saphenous vein ablation.  CHIEF COMPLAINT: swelling, varicosities  HISTORY OF PRESENT ILLNESS: Regina Combs is a 71 y.o. female referred to clinic for evaluation of venous varicosities.  Patient reports swollen legs, which worsened over the course of the day.  The swelling causes some feelings of heaviness and discomfort.  She also notices some bulging varicosities in her right worse than left leg.  Patient reports compliance with three months of compression therapy.  He is wearing compression today.  The patient reports mild benefit from compression therapy.  Past Medical History:  Diagnosis Date   Arthritis    left knee osteoarthritis.   Asthma    no recent issues since a yr ago-Uses Inhalers as needed.   GERD (gastroesophageal reflux disease)    omeprazole helps.   Hypertension    PONV (postoperative nausea and vomiting)    states scopalamine patches helped N and V in past   Poor venous access    "very difficult to get IV's"   Status post implantation of urinary electronic stimulator device    right buttocks -remains inplace- Dr.Frank Lewie Loron [id number] Urology ,[facility]   Stroke [facility])    [phone] left sides weakness [facility] Stay of x5 days.    Past Surgical History:  Procedure Laterality Date   ABDOMINAL HYSTERECTOMY     CHOLECYSTECTOMY     Laparoscopic - [facility], [place] , [place]   TONSILLECTOMY     TOTAL KNEE ARTHROPLASTY Left 05/22/2016   Procedure: LEFT TOTAL KNEE ARTHROPLASTY;  Surgeon: Paralee Cancel, MD;  Location: [facility] ORS;   Service: Orthopedics;  Laterality: Left;   Urinary Stimulator Right    buttocks-remains -has remote device.    Family History  Problem Relation Age of Onset   Breast cancer Mother 26    Social History   Socioeconomic History   Marital status: Single    Spouse name: Not on file   Number of children: Not on file   Years of education: Not on file   Highest education level: Not on file  Occupational History   Not on file  Tobacco Use   Smoking status: Never   Smokeless tobacco: Never  Substance and Sexual Activity   Alcohol use: Yes    Comment: social -rare   Drug use: No   Sexual activity: Not Currently    Birth control/protection: Surgical  Other Topics Concern   Not on file  Social History Narrative   Not on file   Social Determinants of Health   Financial Resource Strain: Not on file  Food Insecurity: Not on file  Transportation Needs: Not on file  Physical Activity: Not on file  Stress: Not on file  Social Connections: Not on file  Intimate Partner Violence: Not on file    Allergies  Allergen Reactions   Pravastatin     Other reaction(s): Other Brain fog   Food Other (See Comments)    Egg whites-gastrointestinal symptoms   Gluten Meal Other (See Comments)    gastrointestinal symptoms   Sulfa Antibiotics Other (See Comments)    Childhood reaction; unsure of reaction.   Adhesive [Tape] Rash    Steri  strips     Diclofenac Diarrhea and Nausea And Vomiting    Current Outpatient Medications  Medication Sig Dispense Refill   acetaminophen (TYLENOL) 500 MG tablet Take 500 mg by mouth every 6 (six) hours as needed for mild pain.     albuterol (PROVENTIL HFA;VENTOLIN HFA) 108 (90 Base) MCG/ACT inhaler Inhale 2 puffs into the lungs every 6 (six) hours as needed.      albuterol (PROVENTIL) (2.5 MG/3ML) 0.083% nebulizer solution Take 2.5 mg by nebulization every 6 (six) hours as needed for wheezing or shortness of breath.      amLODipine (NORVASC) 10 MG tablet  Take 10 mg by mouth daily.     aspirin EC 81 MG tablet Take 81 mg by mouth daily.     famotidine (PEPCID) 20 MG tablet Take 20 mg by mouth 2 (two) times daily.     gabapentin (NEURONTIN) 400 MG capsule 1 capsule     ibuprofen (ADVIL) 200 MG tablet Take 200 mg by mouth every 6 (six) hours as needed for moderate pain.     losartan (COZAAR) 100 MG tablet Take 100 mg by mouth daily.     omeprazole (PRILOSEC) 40 MG capsule Take 40 mg by mouth daily.     rosuvastatin (CRESTOR) 5 MG tablet Take 5 mg by mouth daily.     SYMBICORT 80-4.5 MCG/ACT inhaler Inhale into the lungs.     No current facility-administered medications for this visit.    REVIEW OF SYSTEMS:  [X]  denotes positive finding, [ ]  denotes negative finding Cardiac  Comments:  Chest pain or chest pressure:    Shortness of breath upon exertion:    Short of breath when lying flat:    Irregular heart rhythm:        Vascular    Pain in calf, thigh, or hip brought on by ambulation:    Pain in feet at night that wakes you up from your sleep:     Blood clot in your veins:    Leg swelling:         Pulmonary    Oxygen at home:    Productive cough:     Wheezing:         Neurologic    Sudden weakness in arms or legs:     Sudden numbness in arms or legs:     Sudden onset of difficulty speaking or slurred speech:    Temporary loss of vision in one eye:     Problems with dizziness:         Gastrointestinal    Blood in stool:     Vomited blood:         Genitourinary    Burning when urinating:     Blood in urine:        Psychiatric    Major depression:         Hematologic    Bleeding problems:    Problems with blood clotting too easily:        Skin    Rashes or ulcers:        Constitutional    Fever or chills:      PHYSICAL EXAM Vitals:   06/20/21 1352  BP: 113/64  Pulse: 72  Resp: 20  Temp: 98.6 F (37 C)  SpO2: 95%  Weight: 187 lb (84.8 kg)  Height: 5' 2.5" (1.588 m)    Constitutional: well appearing.  no distress. Appears well nourished.  Neurologic: CN intact. no focal findings. no sensory loss.  Psychiatric:  Mood and affect symmetric and appropriate. Eyes:  No icterus. No conjunctival pallor. Ears, nose, throat:  mucous membranes moist. Midline trachea.  Cardiac: regular rate and rhythm.  Respiratory:  unlabored. Abdominal:  soft, non-tender, non-distended.  Peripheral vascular: 2+ DP pulses. Scattered varicosities and reticular veins about the pretibial skin, right worse than left. Extremity: 1+ bilateral lower extremity calf and ankle edema. no cyanosis. no pallor.  Skin: no gangrene. no ulceration.  Lymphatic: no Stemmer's sign. no palpable lymphadenopathy.  PERTINENT LABORATORY AND RADIOLOGIC DATA  Most recent CBC CBC Latest Ref Rng & Units 09/04/2019 03/27/2017 05/24/2016  WBC 4.0 - 10.5 K/uL 10.2 11.7(H) 13.4(H)  Hemoglobin 12.0 - 15.0 g/dL 13.4 11.0(L) 10.3(L)  Hematocrit 36.0 - 46.0 % 41.6 32.8(L) 30.1(L)  Platelets 150 - 400 K/uL 354 388 315     Most recent CMP CMP Latest Ref Rng & Units 09/04/2019 03/27/2017 05/24/2016  Glucose 70 - 99 mg/dL 87 101(H) 115(H)  BUN 8 - 23 mg/dL 11 12 22(H)  Creatinine 0.44 - 1.00 mg/dL 0.85 1.16(H) 1.01(H)  Sodium 135 - 145 mmol/L 140 133(L) 140  Potassium 3.5 - 5.1 mmol/L 3.9 2.8(L) 3.8  Chloride 98 - 111 mmol/L 107 96(L) 104  CO2 22 - 32 mmol/L 24 24 29   Calcium 8.9 - 10.3 mg/dL 9.4 9.5 9.6   Venous duplex shows superficial reflux in the right anterior accessory saphenous vein.   Yevonne Aline. Stanford Breed, MD [facility] Phone Number: [phone] 06/20/2021 2:20 PM  Total time spent on preparing this encounter including chart review, data review, collecting history, examining the patient, coordinating care for this new patient, 45 minutes.  Portions of this report may have been transcribed using voice recognition software.  Every effort has been made to ensure accuracy; however, inadvertent  computerized transcription errors may still be present.

## 2021-06-20 ENCOUNTER — Encounter: Payer: Self-pay | Admitting: Vascular Surgery

## 2021-06-20 ENCOUNTER — Ambulatory Visit (HOSPITAL_COMMUNITY)
Admission: RE | Admit: 2021-06-20 | Discharge: 2021-06-20 | Disposition: A | Discharge status: Home / Self Care | Payer: Medicare HMO | Source: Ambulatory Visit | Attending: Vascular Surgery | Admitting: Vascular Surgery

## 2021-06-20 ENCOUNTER — Other Ambulatory Visit: Payer: Self-pay

## 2021-06-20 ENCOUNTER — Ambulatory Visit: Payer: Medicare HMO | Admitting: Vascular Surgery

## 2021-06-20 VITALS — BP 113/64 | HR 72 | Temp 98.6°F | Resp 20 | Ht 62.5 in | Wt 187.0 lb

## 2021-06-20 DIAGNOSIS — I872 Venous insufficiency (chronic) (peripheral): Secondary | ICD-10-CM

## 2021-06-21 ENCOUNTER — Other Ambulatory Visit: Payer: Self-pay | Admitting: Sports Medicine

## 2021-06-21 ENCOUNTER — Telehealth: Payer: Self-pay

## 2021-06-21 DIAGNOSIS — M25552 Pain in left hip: Secondary | ICD-10-CM

## 2021-06-21 NOTE — Telephone Encounter (Signed)
Pt is going to fax [place] the documentation from her PCP stating she has been in compression stockings for >3 months.

## 2021-06-26 ENCOUNTER — Other Ambulatory Visit: Payer: Self-pay

## 2021-06-26 ENCOUNTER — Ambulatory Visit
Admission: RE | Admit: 2021-06-26 | Discharge: 2021-06-26 | Disposition: A | Discharge status: Home / Self Care | Payer: Medicare HMO | Source: Ambulatory Visit | Attending: Sports Medicine | Admitting: Sports Medicine

## 2021-06-26 DIAGNOSIS — M25552 Pain in left hip: Secondary | ICD-10-CM

## 2021-07-05 ENCOUNTER — Encounter: Payer: Self-pay | Admitting: Vascular Surgery

## 2021-07-05 ENCOUNTER — Other Ambulatory Visit: Payer: Self-pay

## 2021-07-05 ENCOUNTER — Ambulatory Visit: Payer: Medicare HMO | Admitting: Vascular Surgery

## 2021-07-05 VITALS — BP 105/64 | HR 90 | Temp 97.9°F | Resp 14 | Ht 64.0 in | Wt 186.0 lb

## 2021-07-05 DIAGNOSIS — I872 Venous insufficiency (chronic) (peripheral): Secondary | ICD-10-CM | POA: Diagnosis not present

## 2021-07-05 NOTE — Progress Notes (Signed)
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  We have again discussed the importance of intermittent leg elevation and the proper positioning for this.  I have encouraged her to continue to wear her thigh-high compression stockings with a gradient of 20 to 30 mmHg.  We have discussed the importance of exercise specifically walking and water aerobics.  I have encouraged her to avoid prolonged sitting and standing.  We have also discussed the importance of maintaining a healthy weight.  She still having significant symptoms and for this reason I think she would be a candidate for 10-20 stab phlebectomies on the right leg and also 2 units of sclerotherapy for her telangiectasias and reticular veins.  She does have some reflux in the anterior accessory saphenous vein but does not have large varicosities along the anterior lateral aspect of the leg thus I do not think the anterior sensory saphenous vein is significantly contributing to the issues in her distal medial thigh and proximal medial calf.  We have reviewed the indications for the procedure and the potential complications.  We will try to schedule this in the near future.    HPI:   Regina Combs is a pleasant 71 y.o. female who was seen by Dr. Lenell Antu on 06/20/2021 with chronic venous insufficiency.  She had swelling discomfort and varicosities in both lower extremities but her symptoms were more significant on the right side.  She had superficial and deep venous reflux.  She had evidence of CEAP C3 venous disease on exam.  She had been elevating her legs and wearing thigh-high compression stockings in addition to exercising.  Given that she was having persistent symptoms it was felt that she might be a candidate for laser ablation of  the anterior accessory saphenous vein on the right.  She comes in for further evaluation.  On my history, she has had varicose veins of the right leg for many years but over the last year these have become more prominent and more symptomatic.  She describes aching pain heaviness and a tired feeling in her legs which is aggravated by standing and relieved with elevation.  She has been wearing her thigh-high compression stockings which do help some.  She is continuing to have symptoms however.  Her symptoms are worse at the end of the day.  She also does complain of some leg swelling.  Past Medical History:  Diagnosis Date   Arthritis    left knee osteoarthritis.   Asthma    no recent issues since a yr ago-Uses Inhalers as needed.   GERD (gastroesophageal reflux disease)    omeprazole helps.   Hypertension    PONV (postoperative nausea and vomiting)    states scopalamine patches helped N and V in past   Poor venous access    "very difficult to get IV's"   Status post implantation of urinary electronic stimulator device    right buttocks -remains inplace- Dr.Frank Mahalia Longest [phone]-[id number] ,[facility]   Stroke [facility])    [phone] left sides weakness [facility] Stay of x5 days.    Family History  Problem Relation Age of Onset   Breast cancer Mother 18    SOCIAL HISTORY: Social History   Tobacco Use   Smoking status: Never   Smokeless tobacco: Never  Substance Use Topics  Alcohol use: Yes    Comment: social -rare    Allergies  Allergen Reactions   Pravastatin     Other reaction(s): Other Brain fog   Food Other (See Comments)    Egg whites-gastrointestinal symptoms   Gluten Meal Other (See Comments)    gastrointestinal symptoms   Sulfa Antibiotics Other (See Comments)    Childhood reaction; unsure of reaction.   Adhesive [Tape] Rash    Steri strips     Diclofenac Diarrhea and Nausea And Vomiting    Current Outpatient Medications  Medication Sig Dispense  Refill   acetaminophen (TYLENOL) 500 MG tablet Take 500 mg by mouth every 6 (six) hours as needed for mild pain.     albuterol (PROVENTIL HFA;VENTOLIN HFA) 108 (90 Base) MCG/ACT inhaler Inhale 2 puffs into the lungs every 6 (six) hours as needed.      albuterol (PROVENTIL) (2.5 MG/3ML) 0.083% nebulizer solution Take 2.5 mg by nebulization every 6 (six) hours as needed for wheezing or shortness of breath.      amLODipine (NORVASC) 10 MG tablet Take 10 mg by mouth daily.     aspirin EC 81 MG tablet Take 81 mg by mouth daily.     famotidine (PEPCID) 20 MG tablet Take 20 mg by mouth 2 (two) times daily.     gabapentin (NEURONTIN) 400 MG capsule 1 capsule     ibuprofen (ADVIL) 200 MG tablet Take 200 mg by mouth every 6 (six) hours as needed for moderate pain.     losartan (COZAAR) 100 MG tablet Take 100 mg by mouth daily.     omeprazole (PRILOSEC) 40 MG capsule Take 40 mg by mouth daily.     rosuvastatin (CRESTOR) 5 MG tablet Take 5 mg by mouth daily.     SYMBICORT 80-4.5 MCG/ACT inhaler Inhale into the lungs.     No current facility-administered medications for this visit.    REVIEW OF SYSTEMS:  [X]  denotes positive finding, [ ]  denotes negative finding Cardiac  Comments:  Chest pain or chest pressure:    Shortness of breath upon exertion:    Short of breath when lying flat:    Irregular heart rhythm:        Vascular    Pain in calf, thigh, or hip brought on by ambulation:    Pain in feet at night that wakes you up from your sleep:     Blood clot in your veins:    Leg swelling:  x       Pulmonary    Oxygen at home:    Productive cough:     Wheezing:         Neurologic    Sudden weakness in arms or legs:     Sudden numbness in arms or legs:     Sudden onset of difficulty speaking or slurred speech:    Temporary loss of vision in one eye:     Problems with dizziness:         Gastrointestinal    Blood in stool:     Vomited blood:         Genitourinary    Burning when  urinating:     Blood in urine:        Psychiatric    Major depression:         Hematologic    Bleeding problems:    Problems with blood clotting too easily:        Skin    Rashes or ulcers:  Constitutional    Fever or chills:     PHYSICAL EXAM:   Vitals:   07/05/21 1521  BP: 105/64  Pulse: 90  Resp: 14  Temp: 97.9 F (36.6 C)  TempSrc: Temporal  SpO2: 96%  Weight: 186 lb (84.4 kg)  Height: 5\' 4"  (1.626 m)    GENERAL: The patient is a well-nourished female, in no acute distress. The vital signs are documented above. CARDIAC: There is a regular rate and rhythm.  VASCULAR: I do not detect carotid bruits. She has palpable pedal pulses. I did look at her right anterior accessory saphenous vein myself with the SonoSite.  There is reflux in the proximal segment however the vein is not especially dilated.  I measured the vein to be about 3 to 4 mm in diameter.  This would be a fairly short segment for laser ablation and this does not appear to be contributing to any significant anterior lateral thigh varicosities.  She has some large varicose veins in her anterior medial right calf and also some varicose veins in her medial right thigh.  She also has telangiectasias and reticular veins in the thigh and calf.         PULMONARY: There is good air exchange bilaterally without wheezing or rales. ABDOMEN: Soft and non-tender with normal pitched bowel sounds.  MUSCULOSKELETAL: There are no major deformities or cyanosis. NEUROLOGIC: No focal weakness or paresthesias are detected. SKIN: There are no ulcers or rashes noted. PSYCHIATRIC: The patient has a normal affect.  DATA:    VENOUS DUPLEX: I have independently interpreted her venous duplex scan that was done on 06/20/2021.  This was of the right lower extremity only.  There was no evidence of deep venous thrombosis.  There was deep venous reflux involving the common femoral vein.  There was some superficial venous  reflux and an anterior accessory saphenous vein with diameters ranging from 3.5-4.5 mm.  There was really minimal reflux in the great saphenous vein and no reflux in the small saphenous vein.       06/22/2021 [facility] [phone]

## 2021-07-24 ENCOUNTER — Other Ambulatory Visit: Payer: Medicare HMO

## 2021-08-02 ENCOUNTER — Encounter: Payer: Self-pay | Admitting: Vascular Surgery

## 2021-08-02 ENCOUNTER — Other Ambulatory Visit: Payer: Self-pay

## 2021-08-02 ENCOUNTER — Ambulatory Visit: Payer: Medicare HMO | Admitting: Vascular Surgery

## 2021-08-02 VITALS — BP 113/66 | HR 69 | Temp 98.2°F | Resp 16 | Ht 64.0 in | Wt 186.0 lb

## 2021-08-02 DIAGNOSIS — I872 Venous insufficiency (chronic) (peripheral): Secondary | ICD-10-CM | POA: Diagnosis not present

## 2021-08-02 HISTORY — PX: OTHER SURGICAL HISTORY: SHX169

## 2021-08-02 NOTE — Progress Notes (Signed)
? ? ?  Stab Phlebectomy Procedure ? ?Falecia Perrett DOB:1950-06-11  08/02/2021 ? ?Consent signed: Yes  Surgeon:C. Scot Dock, MD ? ?Procedure: stab phlebectomy: right leg ? ?BP 113/66 (BP Location: Right Arm, Patient Position: Sitting, Cuff Size: Large)   Pulse 69   Temp 98.2 ?F (36.8 ?C) (Temporal)   Resp 16   Ht 5\' 4"  (1.626 m)   Wt 186 lb (84.4 kg)   SpO2 99%   BMI 31.93 kg/m?  ? ?Start time: 11:00 AM    End time: 11:45 AM  ? ? ?Tumescent Anesthesia: 250 cc 0.9% NaCl with 50 cc Lidocaine HCL with 1% Epi and 15 cc 8.4% NaHCO3 ? ?Local Anesthesia: 4 cc Lidocaine HCL and NaHCO3 (ratio 2:1) ? ? ? ?Stab Phlebectomy: 10-20 Sites: Thigh and Calf (right leg)  ? ?Patient tolerated procedure well: Yes ? ?Notes: Ms. Burggraf wore facial mask. All staff members wore facial masks.  Steristrips NOT used as Ms. Goosby has adverse reaction to steristrips.  Ativan 1 mg taken by Josph Macho on 08-02-2021 at 8:45 AM and at 10:10 AM.   ? ?Description of Procedure: ? ?After marking the course of the secondary varicosities, the patient was placed on the operating table in the supine position, and the right leg was prepped and draped in sterile fashion.   ? ?The patient was then put into Trendelenburg position.  Local anesthetic was administered at the previously marked varicosities, and tumescent anesthesia was administered around the vessels.  Ten to 20 stab wounds were made using the tip of an 11 blade. And using the vein hook, the phlebectomies were performed using a hemostat to avulse the varicosities.  Adequate hemostasis was achieved, and steri strips were applied to the stab wound.   ? ? ?ABD pads and thigh high compression stockings were applied as well ace wraps where needed. Blood loss was less than 15 cc.  The patient was taken out of the operating room via wheel chair to her car having tolerated the procedure well.  Hard copy of post operative instructions given to patient.   ?

## 2021-08-02 NOTE — Progress Notes (Signed)
? ?  Patient name: Floreen Teegarden MRN: [id number] DOB: 1951-02-26 Sex: female ? ?REASON FOR VISIT: For stab phlebectomies right leg ? ?HPI: ?Zulay Corrie is a 71 y.o. female who presented with painful varicose veins of her right lower extremity.  She had failed conservative treatment and presents for stab phlebectomies. ? ?Current Outpatient Medications  ?Medication Sig Dispense Refill  ? acetaminophen (TYLENOL) 500 MG tablet Take 500 mg by mouth every 6 (six) hours as needed for mild pain.    ? albuterol (PROVENTIL HFA;VENTOLIN HFA) 108 (90 Base) MCG/ACT inhaler Inhale 2 puffs into the lungs every 6 (six) hours as needed.     ? albuterol (PROVENTIL) (2.5 MG/3ML) 0.083% nebulizer solution Take 2.5 mg by nebulization every 6 (six) hours as needed for wheezing or shortness of breath.     ? amLODipine (NORVASC) 10 MG tablet Take 10 mg by mouth daily.    ? aspirin EC 81 MG tablet Take 81 mg by mouth daily.    ? famotidine (PEPCID) 20 MG tablet Take 20 mg by mouth 2 (two) times daily.    ? gabapentin (NEURONTIN) 400 MG capsule 1 capsule    ? ibuprofen (ADVIL) 200 MG tablet Take 200 mg by mouth every 6 (six) hours as needed for moderate pain.    ? losartan (COZAAR) 100 MG tablet Take 100 mg by mouth daily.    ? omeprazole (PRILOSEC) 40 MG capsule Take 40 mg by mouth daily.    ? rosuvastatin (CRESTOR) 5 MG tablet Take 5 mg by mouth daily.    ? SYMBICORT 80-4.5 MCG/ACT inhaler Inhale into the lungs.    ? ?No current facility-administered medications for this visit.  ? ? ?PHYSICAL EXAM: ?Vitals:  ? 08/02/21 1027  ?BP: 113/66  ?Pulse: 69  ?Resp: 16  ?Temp: 98.2 ?F (36.8 ?C)  ?TempSrc: Temporal  ?SpO2: 99%  ?Weight: 186 lb (84.4 kg)  ?Height: 5\' 4"  (1.626 m)  ? ? ?PROCEDURE: 10-20 stab phlebectomies right leg ? ?TECHNIQUE: The patient was taken to the exam room and the dilated varicose veins were marked with the patient standing.  The patient was then placed supine.  The right leg was prepped and draped in the usual sterile  fashion.  Using approximately 15 small stab incisions with an 11 blade the vein was "brought above the skin and grasped with a hemostat and then bluntly excised.  Pressure was held for hemostasis.  She has a reaction to Steri-Strips are reaper Steri-Strips were not used.  A pressure dressing was applied.  Patient tolerated the procedure well will return in 6 weeks for follow-up visit.  She knows to call sooner if she has problems. ? ? ?[facility] [place] ?[phone] ? ? ?

## 2021-08-22 ENCOUNTER — Telehealth: Payer: Self-pay | Admitting: *Deleted

## 2021-08-22 NOTE — Telephone Encounter (Signed)
Returning Mrs. Elson telephone message regarding irritation in right upper thigh.  Mrs. Oliveri is s/p stab phlebectomy 10-20 incisions right thigh and calf by Dr. Edilia Bo on 08-02-2021.  Mrs. Cass states the past 2 days she has noticed right inner thigh irritation.  Mrs. Zabawa states no bleeding, oozing, or redness in the right inner thigh. She states the areas of irritation are where stab phlebectomy incisions are located.  Steristrips were not placed over her calf and thigh incisions as Mrs. Perno is allergic/has adverse reaction to steristrips.  Recommended that she wear loose fitting pants or skirt that will not touch or rub the right thigh.  Reminded Mrs. Renaldo that she has a follow up appointment on 09-13-2021 with Dr. Edilia Bo and to call if her symptoms worsen or if she has further questions.    ?

## 2021-08-30 ENCOUNTER — Other Ambulatory Visit: Payer: Self-pay | Admitting: Vascular Surgery

## 2021-08-30 MED ORDER — LORAZEPAM 1 MG PO TABS: 1.0000 mg | ORAL_TABLET | Freq: Once | ORAL | 0 refills | Status: AC

## 2021-08-30 NOTE — Progress Notes (Signed)
I called in a prescription for Ativan for this patient for her upcoming sclerotherapy. ?

## 2021-09-13 ENCOUNTER — Ambulatory Visit: Payer: Medicare HMO | Admitting: Vascular Surgery

## 2021-09-13 ENCOUNTER — Encounter: Payer: Self-pay | Admitting: Vascular Surgery

## 2021-09-13 VITALS — BP 108/71 | HR 73 | Temp 97.3°F | Resp 14 | Wt 189.0 lb

## 2021-09-13 DIAGNOSIS — I872 Venous insufficiency (chronic) (peripheral): Secondary | ICD-10-CM

## 2021-09-13 NOTE — Progress Notes (Signed)
? ?  Patient name: Regina Combs MRN: [id number] DOB: 1950/05/31 Sex: female ? ?REASON FOR VISIT:  ? ?Follow-up after stab phlebectomies ? ?HPI:  ? ?Regina Combs is a pleasant 71 y.o. female who underwent 10-20 stab phlebectomies on 08/02/2021.  She comes in just to have her wounds checked quickly.  She has no specific complaints.  She does have some telangiectasias and is going to schedule sclerotherapy with Cherene Julian, RN.  ? ?Current Outpatient Medications  ?Medication Sig Dispense Refill  ? acetaminophen (TYLENOL) 500 MG tablet Take 500 mg by mouth every 6 (six) hours as needed for mild pain.    ? albuterol (PROVENTIL HFA;VENTOLIN HFA) 108 (90 Base) MCG/ACT inhaler Inhale 2 puffs into the lungs every 6 (six) hours as needed.     ? albuterol (PROVENTIL) (2.5 MG/3ML) 0.083% nebulizer solution Take 2.5 mg by nebulization every 6 (six) hours as needed for wheezing or shortness of breath.     ? amLODipine (NORVASC) 10 MG tablet Take 10 mg by mouth daily.    ? aspirin EC 81 MG tablet Take 81 mg by mouth daily.    ? famotidine (PEPCID) 20 MG tablet Take 20 mg by mouth 2 (two) times daily.    ? gabapentin (NEURONTIN) 400 MG capsule 1 capsule    ? ibuprofen (ADVIL) 200 MG tablet Take 200 mg by mouth every 6 (six) hours as needed for moderate pain.    ? losartan (COZAAR) 100 MG tablet Take 100 mg by mouth daily.    ? omeprazole (PRILOSEC) 40 MG capsule Take 40 mg by mouth daily.    ? rosuvastatin (CRESTOR) 5 MG tablet Take 5 mg by mouth daily.    ? SYMBICORT 80-4.5 MCG/ACT inhaler Inhale into the lungs.    ? ?No current facility-administered medications for this visit.  ? ? ?REVIEW OF SYSTEMS:  ?[X]  denotes positive finding, [ ]  denotes negative finding ?Vascular    ?Leg swelling    ?Cardiac    ?Chest pain or chest pressure:    ?Shortness of breath upon exertion:    ?Short of breath when lying flat:    ?Irregular heart rhythm:    ?Constitutional    ?Fever or chills:    ? ?PHYSICAL EXAM:  ? ?Vitals:  ? 09/13/21 1331  ?BP:  108/71  ?Pulse: 73  ?Resp: 14  ?Temp: (!) 97.3 ?F (36.3 ?C)  ?TempSrc: Temporal  ?SpO2: 97%  ?Weight: 189 lb (85.7 kg)  ? ? ?GENERAL: The patient is a well-nourished female, in no acute distress. The vital signs are documented above. ?CARDIOVASCULAR: There is a regular rate and rhythm. ?PULMONARY: There is good air exchange bilaterally without wheezing or rales. ?She has minimal bruising in the right leg. ? ?DATA:  ? ?No new data ? ?MEDICAL ISSUES:  ? ?S/P STAB PHLEBECTOMIES: Her incisions are all healing nicely.  She has minimal bruising.  We have discussed the importance of daily leg elevation, compression stockings, and exercise.  I will see her back as needed.  She will schedule her sclerotherapy with , RN. ? ?09/15/21 ?[facility] ?[phone] ?

## 2021-09-15 ENCOUNTER — Ambulatory Visit: Payer: Medicare HMO

## 2021-09-15 DIAGNOSIS — I872 Venous insufficiency (chronic) (peripheral): Secondary | ICD-10-CM

## 2021-09-15 DIAGNOSIS — I8393 Asymptomatic varicose veins of bilateral lower extremities: Secondary | ICD-10-CM

## 2021-09-15 NOTE — Progress Notes (Signed)
Treated pt's small reticular veins and spider veins of BLE with Asclera 1% administered with a 27g butterfly.  Patient received a total of 4 mL. Pt was jumpy but tolerated well. Provided post treatment care instructions on both handout and verbally. Will follow PRN.  ? ?Photos: Yes.    Compression stockings applied: Yes.   ? ? ? ? ? ? ? ? ? ? ? ?

## 2021-09-20 ENCOUNTER — Ambulatory Visit: Payer: Medicare HMO

## 2021-09-27 ENCOUNTER — Ambulatory Visit: Payer: Medicare HMO | Admitting: Vascular Surgery

## 2021-11-24 ENCOUNTER — Other Ambulatory Visit: Payer: Self-pay | Admitting: Physician Assistant

## 2021-11-24 DIAGNOSIS — Z1231 Encounter for screening mammogram for malignant neoplasm of breast: Secondary | ICD-10-CM

## 2021-12-06 ENCOUNTER — Ambulatory Visit: Payer: Medicare HMO

## 2022-07-17 ENCOUNTER — Other Ambulatory Visit (HOSPITAL_BASED_OUTPATIENT_CLINIC_OR_DEPARTMENT_OTHER): Payer: Self-pay

## 2022-07-17 DIAGNOSIS — J45909 Unspecified asthma, uncomplicated: Secondary | ICD-10-CM

## 2022-07-19 ENCOUNTER — Encounter (HOSPITAL_BASED_OUTPATIENT_CLINIC_OR_DEPARTMENT_OTHER): Payer: Self-pay | Admitting: Pulmonary Disease

## 2022-07-19 ENCOUNTER — Ambulatory Visit (HOSPITAL_BASED_OUTPATIENT_CLINIC_OR_DEPARTMENT_OTHER): Payer: Medicare HMO | Admitting: Pulmonary Disease

## 2022-07-19 ENCOUNTER — Ambulatory Visit (INDEPENDENT_AMBULATORY_CARE_PROVIDER_SITE_OTHER): Payer: Medicare HMO | Admitting: Pulmonary Disease

## 2022-07-19 VITALS — BP 114/60 | HR 88 | Ht 64.0 in | Wt 196.0 lb

## 2022-07-19 DIAGNOSIS — J45909 Unspecified asthma, uncomplicated: Secondary | ICD-10-CM

## 2022-07-19 DIAGNOSIS — J4551 Severe persistent asthma with (acute) exacerbation: Secondary | ICD-10-CM | POA: Diagnosis not present

## 2022-07-19 LAB — PULMONARY FUNCTION TEST
DL/VA % pred: 95 %
DL/VA: 3.96 ml/min/mmHg/L
DLCO cor % pred: 91 %
DLCO cor: 17.87 ml/min/mmHg
DLCO unc % pred: 91 %
DLCO unc: 17.87 ml/min/mmHg
FEF 25-75 Post: 3.49 L/sec
FEF 25-75 Pre: 2.02 L/sec
FEF2575-%Change-Post: 72 %
FEF2575-%Pred-Post: 179 %
FEF2575-%Pred-Pre: 103 %
FEV1-%Change-Post: 12 %
FEV1-%Pred-Post: 104 %
FEV1-%Pred-Pre: 92 %
FEV1-Post: 2.39 L
FEV1-Pre: 2.12 L
FEV1FVC-%Change-Post: 2 %
FEV1FVC-%Pred-Pre: 106 %
FEV6-%Change-Post: 10 %
FEV6-%Pred-Post: 99 %
FEV6-%Pred-Pre: 89 %
FEV6-Post: 2.88 L
FEV6-Pre: 2.61 L
FEV6FVC-%Pred-Post: 104 %
FEV6FVC-%Pred-Pre: 104 %
FVC-%Change-Post: 10 %
FVC-%Pred-Post: 94 %
FVC-%Pred-Pre: 86 %
FVC-Post: 2.88 L
FVC-Pre: 2.61 L
Post FEV1/FVC ratio: 83 %
Post FEV6/FVC ratio: 100 %
Pre FEV1/FVC ratio: 81 %
Pre FEV6/FVC Ratio: 100 %
RV % pred: 141 %
RV: 3.06 L
TLC % pred: 159 %
TLC: 8.04 L

## 2022-07-19 MED ORDER — PREDNISONE 10 MG PO TABS: ORAL_TABLET | ORAL | 0 refills | Status: AC

## 2022-07-19 MED ORDER — MONTELUKAST SODIUM 10 MG PO TABS: 10.0000 mg | ORAL_TABLET | Freq: Every day | ORAL | 5 refills | Status: DC

## 2022-07-19 NOTE — Patient Instructions (Addendum)
Severe persistent asthma with exacerbation --Prednisone taper as needed --CONTINUE Advair 250-50 mcg ONE puff in morning and evening. Rinse mouth out after use --Enroll Fasenra --START singulair 10 mg nightly --REFER to pulmonary rehab  Follow-up with me in (May)

## 2022-07-19 NOTE — Progress Notes (Signed)
Subjective:   PATIENT ID: Regina Combs GENDER: female DOB: [birth year], MRN: [id number]  Chief Complaint  Patient presents with   Consult    PFT results    Reason for Visit: New consult for bronchitis  [name] is a 72 year old female never smoker with allergic rhinitis, chronic bronchitis, asthma, HTN, HLD who presents for evaluation for chronic bronchitis and PFT review.  Every three years she has severe bronchitis. This year her symptoms have seemed prolonged. She developed productive cough, shortness of breath that began two months ago when she was treated for flu on 05/17/22 with her PCP. Denies wheezing. She continued to have persistent symptoms and treated for asthma exacerbation with prednisone on 05/31/22, 06/14/22 and 07/02/22. She recently completed her third round of steroids by this visit. On cough syrup nightly. Cough will return after stopping the prednisone. Worse in the evening and at night. Triggered by walking, wind and cold. Hoarseness developed before starting inhalers. She is currently on Advair 250-50. Has been compliant for the last two weeks and unsure if it helps but maybe has some improvement. Previously using rescue inhaler but not currently using. 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  Past Medical History:  Diagnosis Date   Arthritis    left knee osteoarthritis.   Asthma    no recent issues since a yr ago-Uses Inhalers as needed.   GERD (gastroesophageal reflux  disease)    omeprazole helps.   Hypertension    PONV (postoperative nausea and vomiting)    states scopalamine patches helped N and V in past   Poor venous access    "very difficult to get IV's"   Status post implantation of urinary electronic stimulator device    right buttocks -remains inplace- Dr.Frank Lewie Loron [id number] Urology ,[facility]   Stroke [facility])    [phone] left sides weakness [facility] Stay of x5 days.     Family History  Problem Relation Age of Onset   Breast cancer Mother 38     Social History   Occupational History   Not on file  Tobacco Use   Smoking status: Never   Smokeless tobacco: Never  Substance and Sexual Activity   Alcohol use: Yes    Comment: social -rare   Drug use: No   Sexual activity: Not Currently    Birth control/protection: Surgical    Allergies  Allergen Reactions   Pravastatin     Other reaction(s): Other Brain fog   Food Other (See Comments)    Egg whites-gastrointestinal symptoms   Gluten Meal Other (See Comments)    gastrointestinal symptoms   Sulfa Antibiotics Other (See Comments)    Childhood reaction; unsure of reaction.   Adhesive [Tape] Rash    Steri strips     Diclofenac Diarrhea and Nausea  And Vomiting     Outpatient Medications Prior to Visit  Medication Sig Dispense Refill   amLODipine (NORVASC) 10 MG tablet Take 10 mg by mouth daily.     aspirin EC 81 MG tablet Take 81 mg by mouth daily.     famotidine (PEPCID) 20 MG tablet Take 20 mg by mouth 2 (two) times daily.     FLUoxetine (PROZAC) 10 MG capsule Take by mouth.     gabapentin (NEURONTIN) 400 MG capsule 1 capsule     losartan (COZAAR) 100 MG tablet Take 100 mg by mouth daily.     omeprazole (PRILOSEC) 40 MG capsule Take 40 mg by mouth daily.     rosuvastatin (CRESTOR) 5 MG tablet Take 5 mg by mouth daily.     acetaminophen (TYLENOL) 500 MG tablet Take 500 mg by mouth every 6 (six) hours as needed for mild pain. (Patient not taking: Reported  on 07/19/2022)     albuterol (PROVENTIL HFA;VENTOLIN HFA) 108 (90 Base) MCG/ACT inhaler Inhale 2 puffs into the lungs every 6 (six) hours as needed.  (Patient not taking: Reported on 07/19/2022)     albuterol (PROVENTIL) (2.5 MG/3ML) 0.083% nebulizer solution Take 2.5 mg by nebulization every 6 (six) hours as needed for wheezing or shortness of breath.  (Patient not taking: Reported on 07/19/2022)     ibuprofen (ADVIL) 200 MG tablet Take 200 mg by mouth every 6 (six) hours as needed for moderate pain. (Patient not taking: Reported on 07/19/2022)     SYMBICORT 80-4.5 MCG/ACT inhaler Inhale into the lungs. (Patient not taking: Reported on 07/19/2022)     No facility-administered medications prior to visit.    Review of Systems  Constitutional:  Negative for chills, diaphoresis, fever, malaise/fatigue and weight loss.  HENT:  Negative for congestion.   Respiratory:  Positive for cough, sputum production and shortness of breath. Negative for hemoptysis and wheezing.   Cardiovascular:  Negative for chest pain, palpitations and leg swelling.     Objective:   Vitals:   07/19/22 1026  BP: 114/60  Pulse: 88  SpO2: 98%  Weight: 196 lb (88.9 kg)  Height: '5\' 4"'$  (1.626 m)   SpO2: 98 % O2 Device: None (Room air)  Physical Exam: General: Well-appearing, no acute distress HENT: [place], AT Eyes: EOMI, no scleral icterus Respiratory: Clear to auscultation bilaterally.  No crackles, wheezing or rales Cardiovascular: RRR, -M/R/G, no JVD Extremities:-Edema,-tenderness Neuro: AAO x4, CNII-XII grossly intact Psych: Normal mood, normal affect  Data Reviewed:  Imaging: CTA 03/27/17 - No PE. Ill-defined RLL nodule 5 x 4 mm CXR 05/18/22 - FINDINGS: Lungs/pleura: No consolidation or effusion. Heart/mediastinum: Normal size and contours. Bones: No acute findings. IMPRESSION: No acute cardiopulmonary disease.  CXR 06/22/22  - FINDINGS: Normal heart size. The lungs and pleural spaces are clear. Thoracic  spondylosis. Cholecystectomy clips. IMPRESSION: No active disease.   PFT: FVC 2.88 (94%) FEV1 2.39 (104%) Ratio 83  TLC 159% RV 141% DLCO 91%. +BD response in FEV1 Interpretation: Normal spirometry however significant bronchodilator response present . Reversible obstructive defect consistent with asthma. Air trapping and hyperinflation present.  Labs: CBC    Component Value Date/Time   WBC 10.2 09/04/2019 1811   RBC 4.79 09/04/2019 1811   HGB 13.4 09/04/2019 1811   HCT 41.6 09/04/2019 1811   PLT 354 09/04/2019 1811   MCV 86.8 09/04/2019 1811   MCH 28.0 09/04/2019 1811   MCHC 32.2 09/04/2019 1811   RDW 14.0 09/04/2019 1811   LYMPHSABS 2.2 09/04/2019  1811   MONOABS 0.8 09/04/2019 1811   EOSABS 0.2 09/04/2019 1811   BASOSABS 0.1 09/04/2019 1811   Labs 09/04/19 - 200 02/08/22 - 300     Assessment & Plan:   Discussion: 72 year old female never smoker with allergic rhinitis, chronic bronchitis, asthma, HTN, HLD who presents for evaluation for chronic bronchitis. PFTs reviewed and consistent with asthma. Currently on medium dose ICS/LABA with persistent symptoms and steroid dependent even with treatment after three rounds. Has peripheral eosinophilia  Discussed biologic agents including Xolair (anti-IgE), Nucala (anti-IL-5), Fasenra (anti-IL-5 receptor alpha) and Dupixent (anti-IL-4 receptor subunit alpha). I believe patient would benefit from biologic agent given uncontrolled symptoms, multiple exacerbations and eosinophilia. We discussed the risks and benefits of these type of medications including anaphylaxis. Patient expressed understanding and would like to pursue treatment if eligible.  Severe persistent asthma, steroid dependent, no currently in exacerbation --Prednisone taper ordered in event for recurrent flare --CONTINUE Advair 250-50 mcg ONE puff in morning and evening. Rinse mouth out after use --Enroll Fasenra --START singulair 10 mg nightly --REFER to pulmonary  rehab  RLL subcentimeter nodule --Low risk. No follow-up needed  Health Maintenance Immunization History  Administered Date(s) Administered   Covid-19, Mrna,Vaccine(Spikevax)57yr and older 02/21/2022   PFIZER(Purple Top)SARS-COV-2 Vaccination 07/02/2019, 07/28/2019   Pfizer Covid-19 Vaccine Bivalent Booster 166yr& up 03/03/2021   CT Lung Screen - not qualified. Never smoker  Orders Placed This Encounter  Procedures   AMB referral to pulmonary rehabilitation    Referral Priority:   Routine    Referral Type:   Consultation    Number of Visits Requested:   1   Meds ordered this encounter  Medications   montelukast (SINGULAIR) 10 MG tablet    Sig: Take 1 tablet (10 mg total) by mouth at bedtime.    Dispense:  30 tablet    Refill:  5   predniSONE (DELTASONE) 10 MG tablet    Sig: Take 4 tablets (40 mg total) by mouth daily with breakfast for 2 days, THEN 3 tablets (30 mg total) daily with breakfast for 2 days, THEN 2 tablets (20 mg total) daily with breakfast for 2 days, THEN 1 tablet (10 mg total) daily with breakfast for 2 days.    Dispense:  20 tablet    Refill:  0    Return in about 3 months (around 10/17/2022).  I have spent a total time of 60-minutes on the day of the appointment reviewing prior documentation, coordinating care and discussing medical diagnosis and plan with the patient/family. Imaging, labs and tests included in this note have been reviewed and interpreted independently by me.  ChEdisonMD [facility] 07/19/2022 10:51 AM  Office Number [phone]

## 2022-07-19 NOTE — Patient Instructions (Signed)
Full PFT Performed Today. 

## 2022-07-19 NOTE — Progress Notes (Signed)
Full PFT Performed Today. 

## 2022-07-23 ENCOUNTER — Telehealth (HOSPITAL_COMMUNITY): Payer: Self-pay

## 2022-07-23 ENCOUNTER — Encounter (HOSPITAL_BASED_OUTPATIENT_CLINIC_OR_DEPARTMENT_OTHER): Payer: Self-pay | Admitting: Pulmonary Disease

## 2022-07-23 ENCOUNTER — Telehealth: Payer: Self-pay

## 2022-07-23 NOTE — Telephone Encounter (Signed)
Received New start paperwork for [facility]. Will update as we work through the benefits process.  Submitted a Prior Authorization request to [employer] for [facility] via CoverMyMeds. Will update once we receive a response.  Key: [id number]

## 2022-07-23 NOTE — Telephone Encounter (Signed)
Received referral from Dr. Loanne Drilling for this pt to participate in Pulmonary Rehab with the diagnosis of Severe Persistent Asthma. Clinical review of pt follow up appt on 07/19/22 Pulmonary office note. Pt appropriate for scheduling for Pulmonary rehab. Will forward to support staff for scheduling and verification of insurance eligibility/benefits with pt consent.   Sheppard Plumber Cardiac and Pulmonary Rehab

## 2022-07-24 NOTE — Progress Notes (Signed)
Fasenra BIV started in new phone encounter.  Knox Saliva, PharmD, MPH, BCPS, CPP Clinical Pharmacist (Rheumatology and Pulmonology)

## 2022-07-25 ENCOUNTER — Other Ambulatory Visit (HOSPITAL_COMMUNITY): Payer: Self-pay

## 2022-07-25 NOTE — Telephone Encounter (Signed)
Received notification from [facility] regarding a prior authorization for [facility]. Authorization has been APPROVED from 05/28/2022 to 05/28/2023. Approval letter sent to scan center.  Per test claim, copay for 28 days supply is $1,800.14  Patient can fill through [place]: [phone]   Authorization # [id number]   Will complete paperwork required for PAP submission.

## 2022-07-30 ENCOUNTER — Encounter (HOSPITAL_BASED_OUTPATIENT_CLINIC_OR_DEPARTMENT_OTHER): Payer: Self-pay | Admitting: Pulmonary Disease

## 2022-08-03 ENCOUNTER — Encounter (HOSPITAL_COMMUNITY): Payer: Self-pay

## 2022-08-22 NOTE — Telephone Encounter (Signed)
Submitted Patient Assistance Application to [employer] for [facility] along with provider portion, PA and income documents. Will update patient when we receive a response.  Phone #: [phone] Fax #: [phone]

## 2022-08-22 NOTE — Telephone Encounter (Signed)
Paperwork that was initially received was the incorrect form, application had to be redone on the form that is specific for pt's with Medicare.  Attempted to call pt for additional information regarding household size and income (which IS required on this form as opposed to the form that was initially completed), left VoiceMail requesting a return call. Direct office number provided. Will await f/u.

## 2022-08-24 NOTE — Telephone Encounter (Signed)
Received fax from [place] for [facility] patient assistance, patient's application has been DENIED due to patient exceeding income threshold of 300% of FPL for household of one [id number]) . Per [employer] rep, the income that was written on application was lower than what they pulled through in their software.  Patient will need to provide tax return and letter stating she does not agree with this to [employer]  ATC patient to discuss. Phone went straight to VM - left message requesting return call   Phone #: [phone] Fax #: [phone]  Knox Saliva, PharmD, MPH, BCPS, CPP Clinical Pharmacist (Rheumatology and Pulmonology)

## 2022-08-24 NOTE — Telephone Encounter (Signed)
Patient returned call - she states she does get some money from trust fund. She has not yet completed taxes for this but will plan to drop off tax return copy once she receives back from [employer] in the next few weeks.  Appeal letter has been drafted and placed in PAP pending info folder in pharmacy office  Knox Saliva, PharmD, MPH, BCPS, CPP Clinical Pharmacist (Rheumatology and Pulmonology)

## 2022-08-27 NOTE — Telephone Encounter (Signed)
Pt dropped income documents off at clinic. Faxed them along with Redetermination Request letter to [employer]. Will await response.  Phone #: [phone] Fax #: [phone]

## 2022-08-28 NOTE — Telephone Encounter (Signed)
Received a fax from  [place] regarding an approval for [facility] patient assistance from 08/28/22 to 05/28/23. Approval letter sent to scan center.  Phone #: [phone] Fax #: [phone]  ATC patient to schedule Fasenra new start. Phone went straight to VM. Left VM requesting return call  Knox Saliva, PharmD, MPH, BCPS, CPP Clinical Pharmacist (Rheumatology and Pulmonology)

## 2022-08-29 NOTE — Telephone Encounter (Signed)
Patient is scheduled for Fasenra new start on 08/30/2022. Will use sample   Knox Saliva, PharmD, MPH, BCPS, CPP Clinical Pharmacist (Rheumatology and Pulmonology)

## 2022-08-30 ENCOUNTER — Ambulatory Visit: Payer: Medicare HMO | Admitting: Pharmacist

## 2022-08-30 DIAGNOSIS — Z7189 Other specified counseling: Secondary | ICD-10-CM

## 2022-08-30 DIAGNOSIS — J4550 Severe persistent asthma, uncomplicated: Secondary | ICD-10-CM

## 2022-08-30 DIAGNOSIS — J4551 Severe persistent asthma with (acute) exacerbation: Secondary | ICD-10-CM

## 2022-08-30 MED ORDER — FLUTICASONE-SALMETEROL 250-50 MCG/ACT IN AEPB: 1.0000 | INHALATION_SPRAY | Freq: Two times a day (BID) | RESPIRATORY_TRACT | 5 refills | Status: DC

## 2022-08-30 MED ORDER — FASENRA PEN 30 MG/ML ~~LOC~~ SOAJ: SUBCUTANEOUS | 5 refills | Status: DC

## 2022-08-30 NOTE — Progress Notes (Signed)
HPI Patient presents today to [facility] to see pharmacy team for [facility] new start.  Past medical history includes severe persistent asthma with eosinophilic phenotype, history of HTN, GERD, diverticulitis, TIA  She states she is nervous to self-inject.  Respiratory Medications Current regimen: Advair 250-106mcg (1 puff twice daily), montelukast 10mg  nightly Patient reports no known adherence challenges  OBJECTIVE Allergies  Allergen Reactions   Pravastatin     Other reaction(s): Other Brain fog   Food Other (See Comments)    Egg whites-gastrointestinal symptoms   Gluten Meal Other (See Comments)    gastrointestinal symptoms   Sulfa Antibiotics Other (See Comments)    Childhood reaction; unsure of reaction.   Adhesive [Tape] Rash    Steri strips     Diclofenac Diarrhea and Nausea And Vomiting    Outpatient Encounter Medications as of 08/30/2022  Medication Sig   acetaminophen (TYLENOL) 500 MG tablet Take 500 mg by mouth every 6 (six) hours as needed for mild pain. (Patient not taking: Reported on 07/19/2022)   albuterol (PROVENTIL HFA;VENTOLIN HFA) 108 (90 Base) MCG/ACT inhaler Inhale 2 puffs into the lungs every 6 (six) hours as needed.  (Patient not taking: Reported on 07/19/2022)   albuterol (PROVENTIL) (2.5 MG/3ML) 0.083% nebulizer solution Take 2.5 mg by nebulization every 6 (six) hours as needed for wheezing or shortness of breath.  (Patient not taking: Reported on 07/19/2022)   amLODipine (NORVASC) 10 MG tablet Take 10 mg by mouth daily.   aspirin EC 81 MG tablet Take 81 mg by mouth daily.   famotidine (PEPCID) 20 MG tablet Take 20 mg by mouth 2 (two) times daily.   FLUoxetine (PROZAC) 10 MG capsule Take by mouth.   gabapentin (NEURONTIN) 400 MG capsule 1 capsule   ibuprofen (ADVIL) 200 MG tablet Take 200 mg by mouth every 6 (six) hours as needed for moderate pain. (Patient not taking: Reported on 07/19/2022)   losartan (COZAAR) 100 MG tablet Take 100 mg by mouth  daily.   montelukast (SINGULAIR) 10 MG tablet Take 1 tablet (10 mg total) by mouth at bedtime.   omeprazole (PRILOSEC) 40 MG capsule Take 40 mg by mouth daily.   rosuvastatin (CRESTOR) 5 MG tablet Take 5 mg by mouth daily.   No facility-administered encounter medications on file as of 08/30/2022.     Immunization History  Administered Date(s) Administered   Covid-19, Mrna,Vaccine(Spikevax)41yrs and older 02/21/2022   PFIZER(Purple Top)SARS-COV-2 Vaccination 07/02/2019, 07/28/2019   Pfizer Covid-19 Vaccine Bivalent Booster 67yrs & up 03/03/2021     PFTs    Latest Ref Rng & Units 07/19/2022    9:16 AM  PFT Results  FVC-Pre L 2.61   FVC-Predicted Pre % 86   FVC-Post L 2.88   FVC-Predicted Post % 94   Pre FEV1/FVC % % 81   Post FEV1/FCV % % 83   FEV1-Pre L 2.12   FEV1-Predicted Pre % 92   FEV1-Post L 2.39   DLCO uncorrected ml/min/mmHg 17.87   DLCO UNC% % 91   DLCO corrected ml/min/mmHg 17.87   DLCO COR %Predicted % 91   DLVA Predicted % 95   TLC L 8.04   TLC % Predicted % 159   RV % Predicted % 141     Eosinophils Most recent blood eosinophil count was 200 cells/microL taken on 05/31/22.   IgE: 167 on 03/03/2015   Assessment   Biologics training for benralizumab Harrington Challenger)  Goals of therapy: Mechanism of action: humanized afucosylated, monoclonal antibody (IgG1, kappa) that  binds to the alpha subunit of the interleukin-5 receptor. IL-5 is the major cytokine responsible for the growth and differentiation, recruitment, activation, and survival of eosinophils (a cell type associated with inflammation and an important component in the pathogenesis of asthma) Reviewed that Harrington Challenger is add-on medication and patient must continue maintenance inhaler regimen. Response to therapy: may take 4 months to determine efficacy. Discussed that patients generally feel improvement sooner than 4 months.  Side effects: antibody development (13%), headache (8%), pharyngitis (5%), injection site  reaction (2.2%)  Dose: 30 mg subcutaneously at Week 0, Week 4, Week 8, then  every 8 weeks thereafter  Administration/Storage:   Reviewed administration sites of thigh or abdomen (at least 2-3 inches away from abdomen). Reviewed the upper arm is only appropriate if caregiver is administering injection  Do not shake the pen/syringe as this could lead to product foaming or precipitation. \ Reviewed storage of medication in refrigerator. Reviewed that Harrington Challenger can be stored at room temperature in unopened carton for up to 14 days.  Side effects: headache (19%), injection site reaction (7-15%), antibody development (6%), backache (5%), fatigue (5%)  Access: Approval of Fasenra through: patient assistance  Patient self-administered Fasenra /mL in right upper thigh using sample Fasenra /mL autoinjector pen NDC: [address]-86 Lot: [id number] Expiration: 06/2024  Patient monitored for 30 minutes for adverse reaction.  Patient tolerated without issue - reported pain with injection. Injection site checked and no redness or swelling noted. Patient denies itchiness and irritation  Medication Reconciliation  A drug regimen assessment was performed, including review of allergies, interactions, disease-state management, dosing and immunization history. Medications were reviewed with the patient, including name, instructions, indication, goals of therapy, potential side effects, importance of adherence, and safe use.  Drug interaction(s): none noted  PLAN Continue Fasenra  at Week 4 on 09/27/22 and Week 8 on 10/25/2022. Then continue Fasenra  every 8 weeks thereafter starting on 12/20/22. Rx sent to:  [employer] patient assistance via fax . Patient provided with pharmacy phone number and advised to call later this week to schedule shipment to home. She talked to [employer] a few days ago and is aware that shipment will be getting delivered in the next week or so to her home Overall remains nervous to  self-inject but thinks she will be able to manage at home with assistance of one of her neighbors who is former Engineer, civil (consulting). She will reach out to clinic with any issues Continue maintenance asthma regimen of: Advair 250-41mcg (1 puff twice daily), montelukast  nightly  All questions encouraged and answered.  Instructed patient to reach out with any further questions or concerns.  Thank you for allowing pharmacy to participate in this patient's care.  This appointment required 45 minutes of patient care (this includes precharting, chart review, review of results, face-to-face care, etc.).  Chesley Mires, PharmD, MPH, BCPS, CPP Clinical Pharmacist (Rheumatology and Pulmonology)

## 2022-08-30 NOTE — Patient Instructions (Signed)
Your next [facility] dose is due on 09/27/22, 10/25/22, and every 8 weeks thereafter (starting on 12/20/22)  CONTINUE Advair Diskus 250-49mcg (1 puff twice daily), montelukast 10mg  nightly . Refill for Advair sent to [facility]  Your prescription will be shipped from [employer] patient assistance program. Their phone number is [phone]  Please call to schedule shipment and confirm address. They will mail your medication to your home.  You will need to be seen by your provider in 3 to 4 months to assess how FASENRA is working for you. Please ensure you have a follow-up appointment scheduled in July or August 2024. Call our clinic if you need to make this appointment.  How to manage an injection site reaction: Remember the 5 C's: COUNTER - leave on the counter at least 30 minutes but up to overnight to bring medication to room temperature. This may help prevent stinging COLD - place something cold (like an ice gel pack or cold water bottle) on the injection site just before cleansing with alcohol. This may help reduce pain CLARITIN - use Claritin (generic name is loratadine) for the first two weeks of treatment or the day of, the day before, and the day after injecting. This will help to minimize injection site reactions CORTISONE CREAM - apply if injection site is irritated and itching CALL ME - if injection site reaction is bigger than the size of your fist, looks infected, blisters, or if you develop hives

## 2022-10-10 ENCOUNTER — Ambulatory Visit (HOSPITAL_BASED_OUTPATIENT_CLINIC_OR_DEPARTMENT_OTHER): Payer: Medicare HMO | Admitting: Pulmonary Disease

## 2022-10-10 ENCOUNTER — Encounter (HOSPITAL_BASED_OUTPATIENT_CLINIC_OR_DEPARTMENT_OTHER): Payer: Self-pay | Admitting: Pulmonary Disease

## 2022-10-10 VITALS — BP 116/66 | HR 68 | Temp 98.1°F | Ht 63.0 in | Wt 201.0 lb

## 2022-10-10 DIAGNOSIS — J4550 Severe persistent asthma, uncomplicated: Secondary | ICD-10-CM | POA: Diagnosis not present

## 2022-10-10 NOTE — Patient Instructions (Addendum)
Severe persistent asthma, steroid dependent, well controlled. No steroids in last 2 months --CONTINUE Fasenra. Started 08/30/22 --CONTINUE Advair 250-50 mcg ONE puff in morning and evening. Rinse mouth out after use --CONTINUE singulair 10 mg nightly --Declined pulmonary rehab. Encourage regular exercise 20-30 min daily. Currently a member of [employer]

## 2022-10-10 NOTE — Progress Notes (Signed)
Subjective:   PATIENT ID: Regina Combs GENDER: female DOB: Jun 26, 1950, MRN: [id number]  Chief Complaint  Patient presents with   Follow-up    Follow up. Patient has no complaints.     Reason for Visit: Follow-up  [name] is a 72 year old female never smoker with allergic rhinitis, chronic bronchitis, asthma, HTN, HLD who presents for evaluation for chronic bronchitis and PFT review.  Every three years she has severe bronchitis. This year her symptoms have seemed prolonged. She developed productive cough, shortness of breath that began two months ago when she was treated for flu on 05/17/22 with her PCP. Denies wheezing. She continued to have persistent symptoms and treated for asthma exacerbation with prednisone on 05/31/22, 06/14/22 and 07/02/22. She recently completed her third round of steroids by this visit. On cough syrup nightly. Cough will return after stopping the prednisone. Worse in the evening and at night. Triggered by walking, wind and cold. Hoarseness developed before starting inhalers. She is currently on Advair 250-50. Has been compliant for the last two weeks and unsure if it helps but maybe has some improvement. Previously using rescue inhaler but not currently using. Hx of allergy shots  10/10/22 Since our last visit she was started on Fasenra on 08/30/22. Has had two injections. Felt like she went through pollen without issue. Her triggers usually occur in winter with cold air, illness and environmental exposures. Denies shortness of breath, wheezing and cough. No longer taking allergy shots since December 2023.   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   Asthma    no recent issues since a yr ago-Uses Inhalers as needed.   GERD (gastroesophageal reflux disease)    omeprazole helps.   Hypertension    PONV (postoperative nausea and vomiting)    states scopalamine patches helped N and V in past   Poor venous access    "very difficult to get IV's"   Status post implantation of urinary electronic stimulator device    right buttocks -remains inplace- Dr.Frank Mahalia Longest [phone] ,[facility]   Stroke [facility])    [phone] left sides weakness [facility] Stay of x5 days.     Family History  Problem Relation Age of Onset   Breast cancer Mother 36     Social History   Occupational History   Not on file  Tobacco Use   Smoking status: Never   Smokeless tobacco: Never  Substance and Sexual Activity   Alcohol use: Yes    Comment: social -rare   Drug use: No   Sexual activity: Not Currently    Birth control/protection: Surgical    Allergies  Allergen Reactions   Pravastatin     Other reaction(s): Other Brain fog   Food Other (See Comments)    Egg whites-gastrointestinal symptoms  Gluten Meal Other (See Comments)    gastrointestinal symptoms   Sulfa Antibiotics Other (See Comments)    Childhood reaction; unsure of reaction.   Adhesive [Tape] Rash    Steri strips     Diclofenac Diarrhea and Nausea And Vomiting     Outpatient Medications Prior to Visit  Medication Sig Dispense Refill   amLODipine (NORVASC) 10 MG tablet Take 10 mg by mouth daily.     aspirin EC 81 MG tablet Take 81 mg by mouth daily.     Benralizumab (FASENRA PEN) 30 MG/ML SOAJ Inject 30mg  into the skin at Week 0 (administered in clinic 08/30/22), 09/27/22, 10/25/2022, then every 8 weeks 1 mL 5   famotidine (PEPCID) 20 MG tablet Take 20 mg by mouth 2 (two) times daily.     fluticasone-salmeterol (ADVAIR DISKUS) 250-50  MCG/ACT AEPB Inhale 1 puff into the lungs in the morning and at bedtime. 60 each 5   gabapentin (NEURONTIN) 400 MG capsule 1 capsule     losartan (COZAAR) 100 MG tablet Take 100 mg by mouth daily.     montelukast (SINGULAIR) 10 MG tablet Take 1 tablet (10 mg total) by mouth at bedtime. 30 tablet 5   omeprazole (PRILOSEC) 40 MG capsule Take 40 mg by mouth daily.     rosuvastatin (CRESTOR) 5 MG tablet Take 5 mg by mouth daily.     acetaminophen (TYLENOL) 500 MG tablet Take 500 mg by mouth every 6 (six) hours as needed for mild pain. (Patient not taking: Reported on 07/19/2022)     albuterol (PROVENTIL HFA;VENTOLIN HFA) 108 (90 Base) MCG/ACT inhaler Inhale 2 puffs into the lungs every 6 (six) hours as needed.  (Patient not taking: Reported on 07/19/2022)     albuterol (PROVENTIL) (2.5 MG/3ML) 0.083% nebulizer solution Take 2.5 mg by nebulization every 6 (six) hours as needed for wheezing or shortness of breath.  (Patient not taking: Reported on 07/19/2022)     FLUoxetine (PROZAC) 10 MG capsule Take by mouth.     ibuprofen (ADVIL) 200 MG tablet Take 200 mg by mouth every 6 (six) hours as needed for moderate pain. (Patient not taking: Reported on 07/19/2022)     No facility-administered medications prior to visit.    Review of Systems  Constitutional:  Negative for chills, diaphoresis, fever, malaise/fatigue and weight loss.  HENT:  Negative for congestion.   Respiratory:  Negative for cough, hemoptysis, sputum production, shortness of breath and wheezing.   Cardiovascular:  Negative for chest pain, palpitations and leg swelling.     Objective:   Vitals:   10/10/22 0946  BP: 116/66  Pulse: 68  Temp: 98.1 F (36.7 C)  TempSrc: Oral  SpO2: 98%  Weight: 91.2 kg  Height: 5\' 3"  (1.6 m)   SpO2: 98 % O2 Device: None (Room air)  Physical Exam: General: Well-appearing, no acute distress HENT: [place], AT Eyes: EOMI, no scleral icterus Respiratory: Clear to auscultation bilaterally.  No crackles,  wheezing or rales Cardiovascular: RRR, -M/R/G, no JVD Extremities:-Edema,-tenderness Neuro: AAO x4, CNII-XII grossly intact Psych: Normal mood, normal affect  Data Reviewed:  Imaging: CTA 03/27/17 - No PE. Ill-defined RLL nodule 5 x 4 mm CXR 05/18/22 (Report only) - FINDINGS: Lungs/pleura: No consolidation or effusion. Heart/mediastinum: Normal size and contours. Bones: No acute findings. IMPRESSION: No acute cardiopulmonary disease.  CXR 06/22/22 (Report only) - FINDINGS: Normal heart size. The lungs and pleural spaces are clear. Thoracic spondylosis. Cholecystectomy clips. IMPRESSION: No active disease.   PFT: FVC 2.88 (94%)  FEV1 2.39 (104%) Ratio 83  TLC 159% RV 141% DLCO 91%. +BD response in FEV1 Interpretation: Normal spirometry however significant bronchodilator response present . Reversible obstructive defect consistent with asthma. Air trapping and hyperinflation present.  Labs: CBC    Component Value Date/Time   WBC 10.2 09/04/2019 1811   RBC 4.79 09/04/2019 1811   HGB 13.4 09/04/2019 1811   HCT 41.6 09/04/2019 1811   PLT 354 09/04/2019 1811   MCV 86.8 09/04/2019 1811   MCH 28.0 09/04/2019 1811   MCHC 32.2 09/04/2019 1811   RDW 14.0 09/04/2019 1811   LYMPHSABS 2.2 09/04/2019 1811   MONOABS 0.8 09/04/2019 1811   EOSABS 0.2 09/04/2019 1811   BASOSABS 0.1 09/04/2019 1811   Labs 09/04/19 - 200 02/08/22 - 300     Assessment & Plan:   Discussion: 72 year old female never smoker with allergic rhinitis, chronic bronchitis, asthma, HTN, HLD who presents for asthma follow-up. Improved symptoms with no steroids in the last two months. Started [facility] 08/30/22.   Severe persistent asthma, steroid dependent, well controlled. No steroids in last 2 months --CONTINUE Fasenra. Started 08/30/22 --CONTINUE Advair 250-50 mcg ONE puff in morning and evening. Rinse mouth out after use --CONTINUE singulair 10 mg nightly --Declined pulmonary rehab. Encourage regular exercise 20-30 min  daily. Currently a member of [employer]  RLL subcentimeter nodule --Low risk. No follow-up needed  Health Maintenance Immunization History  Administered Date(s) Administered   Covid-19, Mrna,Vaccine(Spikevax)47yrs and older 02/21/2022   PFIZER(Purple Top)SARS-COV-2 Vaccination 07/02/2019, 07/28/2019   Pfizer Covid-19 Vaccine Bivalent Booster 100yrs & up 03/03/2021   CT Lung Screen - not qualified. Never smoker  No orders of the defined types were placed in this encounter.  No orders of the defined types were placed in this encounter.   Return in about 6 months (around 04/12/2023).  I have spent a total time of 35-minutes on the day of the appointment including chart review, data review, collecting history, coordinating care and discussing medical diagnosis and plan with the patient/family. Past medical history, allergies, medications were reviewed. Pertinent imaging, labs and tests included in this note have been reviewed and interpreted independently by me.  Anterrio Mccleery Mechele Collin, MD [facility]

## 2022-11-19 ENCOUNTER — Encounter: Payer: Self-pay | Admitting: Neurology

## 2022-11-19 ENCOUNTER — Ambulatory Visit: Payer: Medicare HMO | Admitting: Neurology

## 2022-11-19 VITALS — BP 116/71 | HR 69 | Ht 63.0 in | Wt 197.8 lb

## 2022-11-19 DIAGNOSIS — R42 Dizziness and giddiness: Secondary | ICD-10-CM

## 2022-11-19 DIAGNOSIS — Z8673 Personal history of transient ischemic attack (TIA), and cerebral infarction without residual deficits: Secondary | ICD-10-CM | POA: Diagnosis not present

## 2022-11-19 NOTE — Patient Instructions (Signed)
I had a long discussion with the patient and her son whom I spoke to over the phone regarding her recurrent severe typical episodes of dizziness and sweating being of unclear etiology.  She has prior history of stroke with mild residual left hemiparesis and recommend further neurovascular evaluation by checking CT angiogram of the brain and neck as we cannot do an MRI due to a spinal cord stimulator.  Check lipid profile and hemoglobin A1c.  Continue aspirin for stroke prevention and maintain aggressive risk factor modification with strict control of hypertension with blood pressure goal below 140/90 and lipids with LDL cholesterol goal below 70 mg percent.  Check 30-day heart monitor for paroxysmal arrhythmias and advised her to follow-up with her cardiologist as well for any cardiac evaluation for this symptoms.  She will return for follow-up in 4 months or call earlier if necessary

## 2022-11-19 NOTE — Progress Notes (Signed)
[facility] [address] [place]. [place] [address] [phone]       OFFICE CONSULT NOTE  [name] Date of Birth:  1951-04-23 Medical Record Number:  [id number]   Referring MD: Elder Negus  Reason for Referral: Dizzy spells  HPI: Regina Combs is a 72 year old pleasant Caucasian lady seen today for initial consultation visit.  History is obtained from the patient and review of electronic medical records.  I personally reviewed pertinent available imaging films in PACS.  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  Patient states that she was visiting [facility] for [place] day when during a 1 week.  She had several episodes of transient dizziness and unsteadiness.  She had about 4 such episodes in the 7-day period.  Patient has no warning and episodes come on suddenly he feels dizzy off balance and unsteady and there is profuse sweating.  The ears feel wet because of the sweating.  Episode lasted 20 minutes.  There is no accompanying headache, nausea, vomiting patient denies any palpitations, chest pressure or irregular heart rhythm.  She denies any chest pain.  These only and 1 episode felt she was about to pass out.  There is no loss of consciousness.  Patient does have a history of a cardiac murmur and sees a cardiologist in [facility].  She denies any prior history of known arrhythmias and has never worn a heart monitor.  She did have a stroke when she was admitted to [facility] for 3 days in March 2012 when she had left-sided weakness.  She underwent outpatient therapy for 2 years and has made a near complete recovery.  She is currently on aspirin.  She has a bladder stimulator which was removed but had some leads left behind with metal and she cannot have an MRI.  She  had a CT scan of the head on 11/06/2022 which I personally reviewed shows no acute abnormality.  There is calcification noted in the left vertebral artery in the V4 segment.  She denies any known history of benign paroxysmal positional vertigo, inner ear disease,, seizures, loss of consciousness, significant head injury.  ROS:   14 system review of systems is positive for dizziness, lightheadedness, sweating, unsteady gait, weakness and all other systems negative  PMH:  Past Medical History:  Diagnosis Date   Arthritis    left knee osteoarthritis.   Asthma    no recent issues since a yr ago-Uses Inhalers as needed.   GERD (gastroesophageal reflux disease)    omeprazole helps.   Hypertension    PONV (postoperative nausea and vomiting)    states scopalamine patches helped N and V in past   Poor venous access    "very difficult to get IV's"   Status post implantation of urinary electronic stimulator device    right buttocks -remains inplace- Dr.Frank Mahalia Longest [phone] ,[facility]   Stroke [facility])    [phone] left sides weakness [facility] Stay of x5 days.    Social History:  Social History   Socioeconomic History   Marital status: Single    Spouse name: Not on file   Number of children: Not on file   Years of education: Not on file   Highest education level: Not on file  Occupational History   Not on file  Tobacco Use   Smoking status: Never   Smokeless  tobacco: Never  Substance and Sexual Activity   Alcohol use: Yes    Comment: social -rare   Drug use: No   Sexual activity: Not Currently    Birth control/protection: Surgical  Other Topics Concern   Not on file  Social History Narrative   Not on file   Social Determinants of Health   Financial Resource Strain: Not on file  Food Insecurity: Not on file  Transportation Needs: Not on file  Physical Activity: Not on file  Stress: Not on file  Social Connections: Not on file  Intimate Partner  Violence: Not on file    Medications:   Current Outpatient Medications on File Prior to Visit  Medication Sig Dispense Refill   albuterol (PROVENTIL HFA;VENTOLIN HFA) 108 (90 Base) MCG/ACT inhaler Inhale 2 puffs into the lungs every 6 (six) hours as needed.     albuterol (PROVENTIL) (2.5 MG/3ML) 0.083% nebulizer solution Take 2.5 mg by nebulization every 6 (six) hours as needed for wheezing or shortness of breath.     amLODipine (NORVASC) 10 MG tablet Take 10 mg by mouth daily.     aspirin EC 81 MG tablet Take 81 mg by mouth daily.     Benralizumab (FASENRA PEN) 30 MG/ML SOAJ Inject 30mg  into the skin at Week 0 (administered in clinic 08/30/22), 09/27/22, 10/25/2022, then every 8 weeks 1 mL 5   famotidine (PEPCID) 20 MG tablet Take 20 mg by mouth 2 (two) times daily.     fluticasone-salmeterol (ADVAIR DISKUS) 250-50 MCG/ACT AEPB Inhale 1 puff into the lungs in the morning and at bedtime. 60 each 5   gabapentin (NEURONTIN) 400 MG capsule 1 capsule     ibuprofen (ADVIL) 200 MG tablet Take 200 mg by mouth every 6 (six) hours as needed for moderate pain.     losartan (COZAAR) 100 MG tablet Take 100 mg by mouth daily.     montelukast (SINGULAIR) 10 MG tablet Take 1 tablet (10 mg total) by mouth at bedtime. 30 tablet 5   omeprazole (PRILOSEC) 40 MG capsule Take 40 mg by mouth daily.     rosuvastatin (CRESTOR) 5 MG tablet Take 5 mg by mouth daily.     FLUoxetine (PROZAC) 10 MG capsule Take by mouth.     No current facility-administered medications on file prior to visit.    Allergies:   Allergies  Allergen Reactions   Pravastatin     Other reaction(s): Other Brain fog   Food Other (See Comments)    Egg whites-gastrointestinal symptoms   Gluten Meal Other (See Comments)    gastrointestinal symptoms   Sulfa Antibiotics Other (See Comments)    Childhood reaction; unsure of reaction.   Adhesive [Tape] Rash    Steri strips     Diclofenac Diarrhea and Nausea And Vomiting    Physical  Exam General: well developed, well nourished pleasant middle-age Caucasian lady, seated, in no evident distress Head: head normocephalic and atraumatic.   Neck: supple with no carotid or supraclavicular bruits Cardiovascular: regular rate and rhythm, no murmurs Musculoskeletal: no deformity Skin:  no rash/petichiae Vascular:  Normal pulses all extremities  Neurologic Exam Mental Status: Awake and fully alert. Oriented to place and time. Recent and remote memory intact. Attention span, concentration and fund of knowledge appropriate. Mood and affect appropriate.  Diminished recall 2/3.  Able to name 13 animals which can walk on 4 legs. Cranial Nerves: Fundoscopic exam reveals sharp disc margins. Pupils equal, briskly reactive to light. Extraocular movements full without nystagmus.  Visual fields full to confrontation. Hearing intact. Facial sensation intact. Face, tongue, palate moves normally and symmetrically.  Motor: Mild left hemiparesis 4+ out of 5 with weakness of left grip and intrinsic hand muscles.  Mild left hip flexor and ankle dorsiflexor weakness.  Diminished fine finger movements on the left.  Orbits right over left upper extremity.  Sensory.: intact to touch , pinprick , position and vibratory sensation.  Coordination: Rapid alternating movements normal in all extremities. Finger-to-nose and heel-to-shin performed accurately bilaterally. Gait and Station: Arises from chair without difficulty. Stance is normal. Gait demonstrates normal stride length and balance . Able to heel, toe and tandem walk without difficulty.  Reflexes: 1+ and symmetric. Toes downgoing.   NIHSS  0 Modified [place]  2   ASSESSMENT: 72 year old lady with recurrent stereotypical episodes of dizziness and sweating and unsteadiness of unclear etiology.  Possibilities include vertebrobasilar TIA versus presyncopal episodes.  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   Vascular risk factors of hypertension hyperlipidemia and prior stroke     PLAN:I had a long discussion with the patient and her son whom I spoke to over the phone regarding her recurrent severe typical episodes of dizziness and sweating being of unclear etiology.  She has prior history of stroke with mild residual left hemiparesis and recommend further neurovascular evaluation by checking CT angiogram of the brain and neck as we cannot do an MRI due to a spinal cord stimulator.  Check lipid profile and hemoglobin A1c.  Continue aspirin for stroke prevention and maintain aggressive risk factor modification with strict control of hypertension with blood pressure goal below 140/90 and lipids with LDL cholesterol goal below 70 mg percent.  Check 30-day heart monitor for paroxysmal arrhythmias and advised her to follow-up with her cardiologist as well for any cardiac evaluation for this symptoms.  She will return for follow-up in 4 months or call earlier if necessary.  Greater than 50% time during this 45-minute consultation visit were spent on counseling and coordination of care about her episodes of dizziness as well as discussion about prior stroke  Delia Heady, MD  Note: This document was prepared with digital dictation and possible smart phrase technology. Any transcriptional errors that result from this process are unintentional.

## 2022-11-20 ENCOUNTER — Telehealth: Payer: Self-pay | Admitting: Neurology

## 2022-11-20 LAB — LIPID PANEL
Chol/HDL Ratio: 1.9 ratio (ref 0.0–4.4)
Cholesterol, Total: 132 mg/dL (ref 100–199)
HDL: 71 mg/dL (ref >39)
LDL Chol Calc (NIH): 48 mg/dL (ref 0–99)
Triglycerides: 63 mg/dL (ref 0–149)
VLDL Cholesterol Cal: 13 mg/dL (ref 5–40)

## 2022-11-20 LAB — HEMOGLOBIN A1C
Est. average glucose Bld gHb Est-mCnc: 123 mg/dL
Hgb A1c MFr Bld: 5.9 % — ABNORMAL HIGH (ref 4.8–5.6)

## 2022-11-20 NOTE — Telephone Encounter (Signed)
sent to GI they obtain [employer] medicare auth [id number] 

## 2022-11-23 NOTE — Progress Notes (Signed)
Kindly inform the patient that screening test for diabetes and cholesterol profile were both normal

## 2022-12-04 ENCOUNTER — Telehealth: Payer: Self-pay | Admitting: Neurology

## 2022-12-04 DIAGNOSIS — R42 Dizziness and giddiness: Secondary | ICD-10-CM

## 2022-12-04 DIAGNOSIS — Z8673 Personal history of transient ischemic attack (TIA), and cerebral infarction without residual deficits: Secondary | ICD-10-CM

## 2022-12-04 NOTE — Addendum Note (Signed)
Addended by: Judi Cong on: 12/04/2022 03:23 PM   Modules accepted: Orders

## 2022-12-04 NOTE — Telephone Encounter (Signed)
Order corrected

## 2022-12-04 NOTE — Telephone Encounter (Signed)
Forgot to route note, see below.

## 2022-12-04 NOTE — Telephone Encounter (Signed)
The cardiac event monitor needs to have [address] as the location.

## 2022-12-10 DIAGNOSIS — Z8673 Personal history of transient ischemic attack (TIA), and cerebral infarction without residual deficits: Secondary | ICD-10-CM

## 2022-12-10 DIAGNOSIS — R42 Dizziness and giddiness: Secondary | ICD-10-CM | POA: Diagnosis not present

## 2022-12-12 ENCOUNTER — Ambulatory Visit
Admission: RE | Admit: 2022-12-12 | Discharge: 2022-12-12 | Disposition: A | Discharge status: Home / Self Care | Payer: Medicare HMO | Source: Ambulatory Visit | Attending: Neurology | Admitting: Neurology

## 2022-12-12 MED ORDER — IOPAMIDOL (ISOVUE-370) INJECTION 76%
75.0000 mL | Freq: Once | INTRAVENOUS | Status: AC | PRN
  Administered 2022-12-12: 75 mL via INTRAVENOUS

## 2022-12-19 ENCOUNTER — Encounter: Payer: Self-pay | Admitting: Neurology

## 2022-12-23 NOTE — Progress Notes (Signed)
Kindly inform the patient that CT angiogram study of the blood vessels of the brain and the neck show any major blockages.

## 2023-01-10 ENCOUNTER — Ambulatory Visit: Payer: Medicare HMO | Attending: Neurology

## 2023-01-10 DIAGNOSIS — R42 Dizziness and giddiness: Secondary | ICD-10-CM

## 2023-01-10 DIAGNOSIS — Z8673 Personal history of transient ischemic attack (TIA), and cerebral infarction without residual deficits: Secondary | ICD-10-CM

## 2023-01-17 ENCOUNTER — Other Ambulatory Visit (HOSPITAL_BASED_OUTPATIENT_CLINIC_OR_DEPARTMENT_OTHER): Payer: Self-pay | Admitting: Pulmonary Disease

## 2023-01-28 NOTE — Progress Notes (Signed)
Kindly inform the patient that heart monitor study did not show significant cardiac arrhythmia or atrial fibrillation.

## 2023-01-29 ENCOUNTER — Telehealth: Payer: Self-pay

## 2023-01-29 NOTE — Telephone Encounter (Signed)
-----   Message from Delia Heady sent at 01/28/2023  8:32 PM EDT ----- Joneen Roach inform the patient that heart monitor study did not show significant cardiac arrhythmia or atrial fibrillation.

## 2023-01-29 NOTE — Telephone Encounter (Signed)
Called patient and informed her per Dr. Pearlean Brownie "Kindly inform the patient that heart monitor study did not show significant cardiac arrhythmia or atrial fibrillation." Pt verbalized understanding. Pt had no questions at this time but was encouraged to call back if questions arise.

## 2023-03-20 ENCOUNTER — Telehealth: Payer: Self-pay | Admitting: Pharmacist

## 2023-03-20 NOTE — Telephone Encounter (Addendum)
Received fax from [employer] that patient is conditionally enrolled into [place] patient assistance program for 2025. A re-enrollment application will need to be completed as well as proof of denial or fund closure from 3 independent charitable foundations (time stamped on or after 05/29/2023)  Patient portion mailed to home today.  Provider portion will be faxed to [facility] location for signature from Dr. Everardo All [phone]).  Staff message sent to schedule f/u appt in Nov with Dr. Everardo All since she is due based on last OV note  Chesley Mires, PharmD, MPH, BCPS, CPP Clinical Pharmacist (Rheumatology and Pulmonology)

## 2023-03-25 NOTE — Telephone Encounter (Signed)
Received fax from [employer] stating that pt is being denied PAP for 2025 based on income eligibility requirements not being met. The pt is also being notified via mail. Pt's current enrollment is good until 05/28/2023. Contacted pt and discussed, she has plans to meet with a Medicare representative to discuss changing her current plan. Advised to discuss the potential for a monthly maximum OOP to better manage the amount she would need to pay at any given time. Pt verbalized understanding to all.  Denial letter sent to scan center, nothing further is needed at this time.

## 2023-03-27 ENCOUNTER — Encounter: Payer: Self-pay | Admitting: Neurology

## 2023-03-27 ENCOUNTER — Ambulatory Visit: Payer: Medicare HMO | Admitting: Neurology

## 2023-03-27 VITALS — BP 110/64 | HR 63 | Ht 64.0 in | Wt 200.0 lb

## 2023-03-27 DIAGNOSIS — Z8673 Personal history of transient ischemic attack (TIA), and cerebral infarction without residual deficits: Secondary | ICD-10-CM | POA: Diagnosis not present

## 2023-03-27 NOTE — Progress Notes (Signed)
[facility] [address] [place]. [place] [address] [phone]       OFFICE FOLLOW-UP NOTE  [name] Date of Birth:  [birth year] Medical Record Number:  [id number]   Referring MD: Regina Combs  Reason for Referral: Dizzy spells  HPI: Update 03/27/2023 : She returns for follow-up after last visit 4 months ago.  She states she has had no further episodes for the last 2 3 months or so.  Last episode she had in August was when the weather was quite hot and she thought that may have triggered it.  She underwent lab work at last visit on 11/19/2022 and LDL cholesterol was optimal at 48 mg percent and hemoglobin A1c at 5.9.  CT angiogram of the brain and neck on 12/12/2022 no large vessel stenosis or occlusion and only mild tortuosity.  Cardiac monitor on 01/10/2023 showed no evidence of any cardiac arrhythmias.  Patient states she is doing well since then no recurrent or new symptoms.  She is tolerating aspirin well without bruising or bleeding and states that blood pressure and cholesterol under good control.  She has no new complaints.  Initial visit 11/19/2022 Regina Combs is a 72 year old pleasant Caucasian lady seen today for initial consultation visit.  History is obtained from the patient and review of electronic medical records.  I personally reviewed pertinent available imaging films in PACS.  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  Patient states that she was visiting [facility] for [facility] day when during a 1 week.  She had several episodes of transient dizziness and unsteadiness.  She had about 4 such episodes in the 7-day period.  Patient has no warning and episodes come on suddenly he feels dizzy off balance and unsteady and there is profuse sweating.  The  ears feel wet because of the sweating.  Episode lasted 20 minutes.  There is no accompanying headache, nausea, vomiting patient denies any palpitations, chest pressure or irregular heart rhythm.  She denies any chest pain.  These only and 1 episode felt she was about to pass out.  There is no loss of consciousness.  Patient does have a history of a cardiac murmur and sees a cardiologist in [facility].  She denies any prior history of known arrhythmias and has never worn a heart monitor.  She did have a stroke when she was admitted to [facility] for 3 days in March 2012 when she had left-sided weakness.  She underwent outpatient therapy for 2 years and has made a near complete recovery.  She is currently on aspirin.  She has a bladder stimulator which was removed but had some leads left behind with metal and she cannot have an MRI.  She had a CT scan of the head on 11/06/2022 which I personally reviewed shows no acute abnormality.  There is calcification noted in the left vertebral artery in the V4 segment.  She denies any known history of benign paroxysmal positional vertigo, inner ear disease,, seizures, loss of consciousness, significant head injury.  ROS:   14 system review of systems is positive for dizziness, lightheadedness, sweating, unsteady gait, weakness and all other systems negative  PMH:  Past Medical History:  Diagnosis Date   Arthritis    left knee osteoarthritis.   Asthma    no recent issues since a yr ago-Uses Inhalers as needed.  GERD (gastroesophageal reflux disease)    omeprazole helps.   Hypertension    PONV (postoperative nausea and vomiting)    states scopalamine patches helped N and V in past   Poor venous access    "very difficult to get IV's"   Status post implantation of urinary electronic stimulator device    right buttocks -remains inplace- Dr.Frank Mahalia Combs [phone] ,[facility]   Stroke [facility])    [phone] left sides weakness [facility] Stay of x5  days.    Social History:  Social History   Socioeconomic History   Marital status: Single    Spouse name: Not on file   Number of children: Not on file   Years of education: Not on file   Highest education level: Not on file  Occupational History   Not on file  Tobacco Use   Smoking status: Never   Smokeless tobacco: Never  Substance and Sexual Activity   Alcohol use: Yes    Comment: social -rare   Drug use: No   Sexual activity: Not Currently    Birth control/protection: Surgical  Other Topics Concern   Not on file  Social History Narrative   Not on file   Social Determinants of Health   Financial Resource Strain: Low Risk  (05/30/2022)   Received from [facility], [facility]   Overall Financial Resource Strain (CARDIA)    Difficulty of Paying Living Expenses: Not hard at all  Food Insecurity: No Food Insecurity (05/30/2022)   Received from [facility], [facility]   Hunger Vital Sign    Worried About Running Out of Food in the Last Year: Never true    Ran Out of Food in the Last Year: Never true  Transportation Needs: No Transportation Needs (05/30/2022)   Received from [employer], [facility]   PRAPARE - Transportation    Lack of Transportation (Medical): No    Lack of Transportation (Non-Medical): No  Physical Activity: Sufficiently Active (05/30/2022)   Received from [facility], [facility]   Exercise Vital Sign    Days of Exercise per Week: 7 days    Minutes of Exercise per Session: 30 min  Stress: No Stress Concern Present (05/30/2022)   Received from [facility], [facility] - Occupational Stress Questionnaire    Feeling of Stress : Not at all  Social Connections: Socially Integrated (05/30/2022)   Received from [facility], [facility]   Social Network    How would you rate your social network (family, work, friends)?: Good participation with social networks  Intimate Partner Violence: Not At  Risk (05/30/2022)   Received from [facility], [facility]   HITS    Over the last 12 months how often did your partner physically hurt you?: 1    Over the last 12 months how often did your partner insult you or talk down to you?: 1    Over the last 12 months how often did your partner threaten you with physical harm?: 1    Over the last 12 months how often did your partner scream or curse at you?: 1    Medications:   Current Outpatient Medications on File Prior to Visit  Medication Sig Dispense Refill   albuterol (PROVENTIL HFA;VENTOLIN HFA) 108 (90 Base) MCG/ACT inhaler Inhale 2 puffs into the lungs every 6 (six) hours as needed.     albuterol (PROVENTIL) (2.5 MG/3ML) 0.083% nebulizer solution Take 2.5 mg by nebulization every 6 (six)  hours as needed for wheezing or shortness of breath.     amLODipine (NORVASC) 10 MG tablet Take 10 mg by mouth daily.     aspirin EC 81 MG tablet Take 81 mg by mouth daily.     Benralizumab (FASENRA PEN) 30 MG/ML SOAJ Inject 30mg  into the skin at Week 0 (administered in clinic 08/30/22), 09/27/22, 10/25/2022, then every 8 weeks 1 mL 5   famotidine (PEPCID) 20 MG tablet Take 20 mg by mouth 2 (two) times daily.     fluticasone-salmeterol (ADVAIR DISKUS) 250-50 MCG/ACT AEPB Inhale 1 puff into the lungs in the morning and at bedtime. 60 each 5   gabapentin (NEURONTIN) 400 MG capsule 1 capsule     ibuprofen (ADVIL) 200 MG tablet Take 200 mg by mouth every 6 (six) hours as needed for moderate pain.     losartan (COZAAR) 100 MG tablet Take 100 mg by mouth daily.     montelukast (SINGULAIR) 10 MG tablet TAKE 1 TABLET BY MOUTH AT BEDTIME 30 tablet 11   omeprazole (PRILOSEC) 40 MG capsule Take 40 mg by mouth daily.     rosuvastatin (CRESTOR) 5 MG tablet Take 5 mg by mouth daily.     FLUoxetine (PROZAC) 10 MG capsule Take by mouth.     No current facility-administered medications on file prior to visit.    Allergies:   Allergies  Allergen Reactions   Pravastatin      Other reaction(s): Other Brain fog   Food Other (See Comments)    Egg whites-gastrointestinal symptoms   Gluten Meal Other (See Comments)    gastrointestinal symptoms   Sulfa Antibiotics Other (See Comments)    Childhood reaction; unsure of reaction.   Adhesive [Tape] Rash    Steri strips     Diclofenac Diarrhea and Nausea And Vomiting    Physical Exam General: well developed, well nourished pleasant middle-age Caucasian lady, seated, in no evident distress Head: head normocephalic and atraumatic.   Neck: supple with no carotid or supraclavicular bruits Cardiovascular: regular rate and rhythm, no murmurs Musculoskeletal: no deformity Skin:  no rash/petichiae Vascular:  Normal pulses all extremities  Neurologic Exam Mental Status: Awake and fully alert. Oriented to place and time. Recent and remote memory intact. Attention span, concentration and fund of knowledge appropriate. Mood and affect appropriate.  Diminished recall 2/3.  Able to name 13 animals which can walk on 4 legs. Cranial Nerves: Fundoscopic exam not done. Pupils equal, briskly reactive to light. Extraocular movements full without nystagmus. Visual fields full to confrontation. Hearing intact. Facial sensation intact. Face, tongue, palate moves normally and symmetrically.  Motor: Mild left hemiparesis 4+ out of 5 with weakness of left grip and intrinsic hand muscles.  Mild left hip flexor and ankle dorsiflexor weakness.  Diminished fine finger movements on the left.  Orbits right over left upper extremity.  Sensory.: intact to touch , pinprick , position and vibratory sensation.  Coordination: Rapid alternating movements normal in all extremities. Finger-to-nose and heel-to-shin performed accurately bilaterally. Gait and Station: Arises from chair without difficulty. Stance is normal. Gait demonstrates normal stride length and balance . Able to heel, toe and tandem walk without difficulty.  Reflexes: 1+ and symmetric.  Toes downgoing.   NIHSS  0 Modified Rankin  2   ASSESSMENT: 72 year old lady with recurrent stereotypical episodes of dizziness and sweating and unsteadiness of unclear etiology.  Possibilities include vertebrobasilar TIA versus presyncopal episodes.  Remote history of right subcortical infarct in 2012 with mild residual left  hemiparesis.  Vascular risk factors of hypertension hyperlipidemia and prior stroke     PLAN:I had a long discussion with the patient regarding her remote episodes of dizziness and sweating of unclear etiology which appear to have resolved.  We also discussed results of CT angiogram of the brain and neck, cardiac monitoring and lab work all of which appear pretty much unremarkable at this time.  I do not recommend any further neurovascular workup at this time.  Continue aspirin for stroke prevention and maintain aggressive risk factor modification with strict control of hypertension with blood pressure goal below 140/90 and lipids with LDL cholesterol goal below 70 mg percent.  Return for follow-up in the future only as needed no scheduled appointment was made. Greater than 50% time during this 35-minute visit were spent on counseling and coordination of care about her episodes of dizziness as well as discussion about prior stroke  Delia Heady, MD  Note: This document was prepared with digital dictation and possible smart phrase technology. Any transcriptional errors that result from this process are unintentional.

## 2023-03-27 NOTE — Patient Instructions (Addendum)
I had a long discussion with the patient regarding her remote episodes of dizziness and sweating of unclear etiology which appear to have resolved.  We also discussed results of CT angiogram of the brain and neck, cardiac monitoring and lab work all of which appear pretty much unremarkable at this time.  I do not recommend any further neurovascular workup at this time.  Continue aspirin for stroke prevention and maintain aggressive risk factor modification with strict control of hypertension with blood pressure goal below 140/90 and lipids with LDL cholesterol goal below 70 mg percent.  Return for follow-up in the future only as needed no scheduled appointment was made.

## 2023-06-05 ENCOUNTER — Telehealth: Payer: Self-pay | Admitting: Pharmacist

## 2023-06-05 NOTE — Telephone Encounter (Signed)
 Patient no longer eligible for Fasenra  PAP due to exceeding income criteria. Running benefits through insurance   Submitted a Prior Authorization request to [facility] for FASENRA  via CoverMyMeds. Will update once we receive a response.  Key: [id number]

## 2023-06-07 ENCOUNTER — Encounter (HOSPITAL_BASED_OUTPATIENT_CLINIC_OR_DEPARTMENT_OTHER): Payer: Self-pay | Admitting: Pulmonary Disease

## 2023-06-07 ENCOUNTER — Telehealth (HOSPITAL_BASED_OUTPATIENT_CLINIC_OR_DEPARTMENT_OTHER): Payer: Self-pay | Admitting: Pulmonary Disease

## 2023-06-07 ENCOUNTER — Other Ambulatory Visit (HOSPITAL_COMMUNITY): Payer: Self-pay

## 2023-06-07 ENCOUNTER — Ambulatory Visit (HOSPITAL_BASED_OUTPATIENT_CLINIC_OR_DEPARTMENT_OTHER): Payer: Medicare HMO | Admitting: Pulmonary Disease

## 2023-06-07 VITALS — BP 112/64 | HR 66 | Resp 16 | Ht 64.0 in | Wt 197.8 lb

## 2023-06-07 DIAGNOSIS — J4550 Severe persistent asthma, uncomplicated: Secondary | ICD-10-CM

## 2023-06-07 DIAGNOSIS — J4551 Severe persistent asthma with (acute) exacerbation: Secondary | ICD-10-CM

## 2023-06-07 MED ORDER — FLUTICASONE-SALMETEROL 100-50 MCG/ACT IN AEPB: 1.0000 | INHALATION_SPRAY | Freq: Two times a day (BID) | RESPIRATORY_TRACT | 11 refills | Status: DC

## 2023-06-07 NOTE — Telephone Encounter (Signed)
 Received notification from [facility]  regarding a prior authorization for FASENRA . Authorization has been APPROVED from 05/29/23 to 05/27/24. Approval letter sent to scan center.  Per test claim, copay for 28 days supply is $1,964.95. Patient could benefit from [facility] Payment Plan  Authorization # Key# [facility]

## 2023-06-07 NOTE — Telephone Encounter (Signed)
 Please clear up the question from patient regarding approval for PAP. Patient states [employer] claimed she was eligible for financial assistance.  She will attempt to getting paperwork to prove this but please check in on our end so there no gaps in care.
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
## 2023-06-07 NOTE — Patient Instructions (Addendum)
 Severe persistent asthma, steroid dependent, well controlled. No steroids in last 2 months --CONTINUE Fasenra . Started 08/30/22. Sent message to pharmacy regarding PAP --De-escalate to Advair 100-50 mcg ONE puff in morning and evening. Rinse mouth out after use --CONTINUE singulair  10 mg nightly --Restart [employer] gym. Encourage regular exercise 20-30 min daily.

## 2023-06-11 MED ORDER — FASENRA PEN 30 MG/ML ~~LOC~~ SOAJ
30.0000 mg | SUBCUTANEOUS | 2 refills | Status: DC
Start: 2023-06-11 — End: 2023-12-19
  Filled 2023-07-10: qty 1, 56d supply, fill #0
  Filled 2023-08-27 (×2): qty 1, 56d supply, fill #1
  Filled 2023-10-15: qty 1, 56d supply, fill #2

## 2023-06-11 MED ORDER — FASENRA PEN 30 MG/ML ~~LOC~~ SOAJ
30.0000 mg | SUBCUTANEOUS | Status: DC
Start: 2023-06-11 — End: 2023-06-11

## 2023-06-11 NOTE — Telephone Encounter (Signed)
 Spoke with patient - she states she will move forward with Medicare Prescription Payment Plan. She is due for dose on 06/22/2023. She will call me back once approved through [facility] Prescription Payment Plan  Sherry Pennant, PharmD, MPH, BCPS, CPP Clinical Pharmacist (Rheumatology and Pulmonology)

## 2023-06-11 NOTE — Telephone Encounter (Signed)
 Spoke with patient - she will call insurance to enroll into [facility] Prescription Payment Plan. She will call back once enrolled so that [facility] can fill rx for her. She is due for next Fasenra dose on 06/22/2023

## 2023-06-13 NOTE — Telephone Encounter (Signed)
Patient left VM stating she will not be enrolling to [facility] Rx Payment Plan. Her son will help to pay for the medication

## 2023-06-14 NOTE — Telephone Encounter (Signed)
Enrolled patient into asthma grant through PAF: Award Period: 12/16/2022 - 06/13/2024 Cardholder: [id number] BIN: [id number] PCN: [id number] Group: [id number] For pharmacy inquiries, contact PDMI at [phone].

## 2023-06-21 ENCOUNTER — Other Ambulatory Visit: Payer: Self-pay | Admitting: Pharmacist

## 2023-06-21 NOTE — Progress Notes (Signed)
Patient no longer eligible for PAP through [employer] for Fasenra. Enrolled into grant for asthma  Continue 30mg  SQ every 8 weeks  Chesley Mires, PharmD, MPH, BCPS, CPP Clinical Pharmacist (Rheumatology and Pulmonology)

## 2023-07-09 ENCOUNTER — Other Ambulatory Visit (HOSPITAL_COMMUNITY): Payer: Self-pay

## 2023-07-10 ENCOUNTER — Other Ambulatory Visit: Payer: Self-pay

## 2023-07-10 ENCOUNTER — Other Ambulatory Visit (HOSPITAL_COMMUNITY): Payer: Self-pay

## 2023-07-10 NOTE — Progress Notes (Signed)
Specialty Pharmacy Initial Fill Coordination Note  Regina Combs is a 73 y.o. female contacted today regarding initial fill of specialty medication(s) Benralizumab [place] Challenger Pen)   Patient requested Delivery   Delivery date: 07/12/23   Verified address: [address]   [place] [address]   Medication will be filled on 07/11/23.   Patient has grant on file and is aware of $0 copayment.

## 2023-07-11 ENCOUNTER — Other Ambulatory Visit: Payer: Self-pay

## 2023-08-27 ENCOUNTER — Other Ambulatory Visit: Payer: Self-pay

## 2023-08-27 ENCOUNTER — Other Ambulatory Visit (HOSPITAL_COMMUNITY): Payer: Self-pay

## 2023-08-27 NOTE — Progress Notes (Signed)
 Specialty Pharmacy Refill Coordination Note  Regina Combs is a 73 y.o. female contacted today regarding refills of specialty medication(s) Benralizumab [place] Challenger Pen)   Patient requested Delivery   Delivery date: 08/29/23   Verified address: [address]    [place] [address]   Medication will be filled on 08/28/23.

## 2023-09-18 ENCOUNTER — Other Ambulatory Visit: Payer: Self-pay | Admitting: Sports Medicine

## 2023-09-18 DIAGNOSIS — M25561 Pain in right knee: Secondary | ICD-10-CM

## 2023-09-20 ENCOUNTER — Ambulatory Visit
Admission: RE | Admit: 2023-09-20 | Discharge: 2023-09-20 | Disposition: A | Discharge status: Home / Self Care | Source: Ambulatory Visit | Attending: Sports Medicine | Admitting: Sports Medicine

## 2023-09-20 DIAGNOSIS — M25561 Pain in right knee: Secondary | ICD-10-CM

## 2023-10-15 ENCOUNTER — Other Ambulatory Visit: Payer: Self-pay

## 2023-10-15 ENCOUNTER — Other Ambulatory Visit: Payer: Self-pay | Admitting: Pharmacy Technician

## 2023-10-15 NOTE — Progress Notes (Signed)
 Specialty Pharmacy Refill Coordination Note  Regina Combs is a 73 y.o. female contacted today regarding refills of specialty medication(s) Benralizumab  (Fasenra  Pen)   Patient requested (Patient-Rptd) Delivery   Delivery date: (Patient-Rptd) 11/01/23   Verified address: (Patient-Rptd) [address] [place], [place] [address]   Medication will be filled on 10/31/23.

## 2023-10-31 ENCOUNTER — Other Ambulatory Visit: Payer: Self-pay

## 2023-12-03 ENCOUNTER — Other Ambulatory Visit (HOSPITAL_BASED_OUTPATIENT_CLINIC_OR_DEPARTMENT_OTHER): Payer: Self-pay

## 2023-12-03 MED ORDER — MONTELUKAST SODIUM 10 MG PO TABS
10.0000 mg | ORAL_TABLET | Freq: Every day | ORAL | 5 refills | Status: DC
Start: 2023-12-03 — End: 2024-04-22

## 2023-12-19 ENCOUNTER — Other Ambulatory Visit: Payer: Self-pay

## 2023-12-19 ENCOUNTER — Other Ambulatory Visit (HOSPITAL_COMMUNITY): Payer: Self-pay

## 2023-12-19 ENCOUNTER — Other Ambulatory Visit (HOSPITAL_BASED_OUTPATIENT_CLINIC_OR_DEPARTMENT_OTHER): Payer: Self-pay | Admitting: Pulmonary Disease

## 2023-12-19 DIAGNOSIS — J4551 Severe persistent asthma with (acute) exacerbation: Secondary | ICD-10-CM

## 2023-12-19 NOTE — Progress Notes (Signed)
 Specialty Pharmacy Refill Coordination Note  Franziska Podgurski is a 73 y.o. female contacted today regarding refills of specialty medication(s) Benralizumab  (Fasenra  Pen)   Patient requested Delivery   Delivery date: 12/31/23   Verified address: [phone]   Medication will be filled on 08.04.25.   This fill date is pending response to refill request from provider. Patient is aware and if they have not received fill by intended date they must follow up with pharmacy.

## 2023-12-24 ENCOUNTER — Other Ambulatory Visit (HOSPITAL_COMMUNITY): Payer: Self-pay

## 2023-12-24 ENCOUNTER — Other Ambulatory Visit: Payer: Self-pay

## 2023-12-24 MED ORDER — FASENRA PEN 30 MG/ML ~~LOC~~ SOAJ
30.0000 mg | SUBCUTANEOUS | 0 refills | Status: DC
  Filled 2023-12-24: qty 1, 56d supply, fill #0

## 2023-12-24 NOTE — Telephone Encounter (Signed)
 Faserna refill

## 2023-12-24 NOTE — Telephone Encounter (Signed)
 Refill sent for FASENRA  to [facility]: [phone]   Dose: 30mg  subcut every 8 weeks  Last OV: 06/07/2023 Provider: Dr. Kassie  Next OV: 02/26/2024  Sherry Pennant, PharmD, MPH, BCPS Clinical Pharmacist (Rheumatology and Pulmonology)

## 2023-12-25 ENCOUNTER — Other Ambulatory Visit: Payer: Self-pay

## 2024-02-10 NOTE — Telephone Encounter (Signed)
 ERROR

## 2024-02-17 ENCOUNTER — Other Ambulatory Visit: Payer: Self-pay

## 2024-02-17 ENCOUNTER — Other Ambulatory Visit (HOSPITAL_BASED_OUTPATIENT_CLINIC_OR_DEPARTMENT_OTHER): Payer: Self-pay | Admitting: Pulmonary Disease

## 2024-02-17 DIAGNOSIS — J4551 Severe persistent asthma with (acute) exacerbation: Secondary | ICD-10-CM

## 2024-02-17 NOTE — Telephone Encounter (Signed)
 Faserna refill

## 2024-02-18 NOTE — Telephone Encounter (Signed)
 Refill request for Fasenra  -   Last filled 12/30/23 with 8 week supply.   Refill pending OV 02/26/24 with Dr. Kassie.  Aleck Puls, PharmD, BCPS Clinical Pharmacist  [facility]

## 2024-02-19 ENCOUNTER — Other Ambulatory Visit: Payer: Self-pay

## 2024-02-19 ENCOUNTER — Other Ambulatory Visit (HOSPITAL_COMMUNITY): Payer: Self-pay

## 2024-02-19 NOTE — Progress Notes (Signed)
 Specialty Pharmacy Refill Coordination Note  Regina Combs is a 73 y.o. female contacted today regarding refills of specialty medication(s) Benralizumab  (Fasenra  Pen)   Patient requested Delivery   Delivery date: 03/03/24   Verified address: [phone]   Medication will be filled on 03/02/24.

## 2024-02-20 ENCOUNTER — Other Ambulatory Visit: Payer: Self-pay

## 2024-02-26 ENCOUNTER — Ambulatory Visit (HOSPITAL_BASED_OUTPATIENT_CLINIC_OR_DEPARTMENT_OTHER): Admitting: Pulmonary Disease

## 2024-02-26 ENCOUNTER — Other Ambulatory Visit: Payer: Self-pay

## 2024-02-26 ENCOUNTER — Other Ambulatory Visit (HOSPITAL_COMMUNITY): Payer: Self-pay

## 2024-02-26 MED ORDER — FASENRA PEN 30 MG/ML ~~LOC~~ SOAJ
30.0000 mg | SUBCUTANEOUS | 0 refills | Status: DC
  Filled 2024-02-26: qty 1, 56d supply, fill #0

## 2024-02-26 NOTE — Telephone Encounter (Signed)
 Refill sent for FASENRA  to [facility]: [phone]   Dose: 30mg  [place] every 8 weeks   Last OV: 06/07/23 Provider: Dr. Kassie   Next OV: (scheduled then cancelled 02/26/24, no rescheduled 04/14/24)   Sending refill to avoid gap in therapy - will need to keep appointment for further refills.  Aleck Puls, PharmD, BCPS Clinical Pharmacist  [facility]

## 2024-03-02 ENCOUNTER — Other Ambulatory Visit: Payer: Self-pay

## 2024-03-19 ENCOUNTER — Other Ambulatory Visit: Payer: Self-pay

## 2024-03-19 NOTE — Progress Notes (Signed)
 Specialty Pharmacy Ongoing Clinical Assessment Note  Regina Combs is a 73 y.o. female who is being followed by the specialty pharmacy service for RxSp Asthma/COPD   Patient's specialty medication(s) reviewed today: Benralizumab  (Fasenra  Pen)   Missed doses in the last 4 weeks: 0   Patient/Caregiver did not have any additional questions or concerns.   Therapeutic benefit summary: Patient is achieving benefit   Adverse events/side effects summary: No adverse events/side effects   Patient's therapy is appropriate to: Continue    Goals Addressed             This Visit's Progress    Maintain optimal adherence to therapy   On track    Patient is on track. Patient will maintain adherence, adhere to provider and/or lab appointments, and be monitored by provider to determine if a change in treatment plan is warranted         Follow up: 12 months  Regina Combs M Regina Combs Specialty Pharmacist

## 2024-04-14 ENCOUNTER — Ambulatory Visit (HOSPITAL_BASED_OUTPATIENT_CLINIC_OR_DEPARTMENT_OTHER): Admitting: Pulmonary Disease

## 2024-04-20 ENCOUNTER — Other Ambulatory Visit (HOSPITAL_BASED_OUTPATIENT_CLINIC_OR_DEPARTMENT_OTHER): Payer: Self-pay | Admitting: Pulmonary Disease

## 2024-04-20 ENCOUNTER — Other Ambulatory Visit: Payer: Self-pay

## 2024-04-20 DIAGNOSIS — J4551 Severe persistent asthma with (acute) exacerbation: Secondary | ICD-10-CM

## 2024-04-20 NOTE — Telephone Encounter (Signed)
 Pt requesting refill of specialty medication - routing to Rx team to advise.

## 2024-04-20 NOTE — Telephone Encounter (Signed)
 Received refill request for Fasenra  - next OV with Dr. Kassie is 04/22/24. Last OV Jan 2025.   Will await next OV with Dr. Kassie prior to authorizing refill.

## 2024-04-22 ENCOUNTER — Encounter (HOSPITAL_BASED_OUTPATIENT_CLINIC_OR_DEPARTMENT_OTHER): Payer: Self-pay | Admitting: Pulmonary Disease

## 2024-04-22 ENCOUNTER — Ambulatory Visit (HOSPITAL_BASED_OUTPATIENT_CLINIC_OR_DEPARTMENT_OTHER): Admitting: Pulmonary Disease

## 2024-04-22 ENCOUNTER — Other Ambulatory Visit (HOSPITAL_COMMUNITY): Payer: Self-pay

## 2024-04-22 ENCOUNTER — Other Ambulatory Visit: Payer: Self-pay

## 2024-04-22 VITALS — BP 130/63 | HR 63 | Temp 98.5°F | Ht 64.0 in | Wt 197.9 lb

## 2024-04-22 DIAGNOSIS — R911 Solitary pulmonary nodule: Secondary | ICD-10-CM | POA: Diagnosis not present

## 2024-04-22 DIAGNOSIS — J309 Allergic rhinitis, unspecified: Secondary | ICD-10-CM

## 2024-04-22 DIAGNOSIS — J4550 Severe persistent asthma, uncomplicated: Secondary | ICD-10-CM

## 2024-04-22 MED ORDER — MONTELUKAST SODIUM 10 MG PO TABS
10.0000 mg | ORAL_TABLET | Freq: Every day | ORAL | 3 refills | Status: AC
Start: 2024-04-22 — End: ?

## 2024-04-22 MED ORDER — FASENRA PEN 30 MG/ML ~~LOC~~ SOAJ
30.0000 mg | SUBCUTANEOUS | 2 refills | Status: AC
  Filled 2024-04-22: qty 1, 56d supply, fill #0
  Filled 2024-06-17: qty 1, 56d supply, fill #1

## 2024-04-22 NOTE — Progress Notes (Signed)
 Specialty Pharmacy Refill Coordination Note  Regina Combs is a 73 y.o. female contacted today regarding refills of specialty medication(s) Benralizumab  (Fasenra  Pen)   Patient requested [name] at [facility] at [place] date: 04/30/24   Medication will be filled on: 04/29/24   Patient prefers pickup and is aware that refill is pending.

## 2024-04-22 NOTE — Patient Instructions (Signed)
 Severe persistent asthma, steroid dependent, well controlled. No steroids in last 2 months --CONTINUE Fasenra  --CONTINUE Advair 100-50 mcg ONE puff in morning and evening. Rinse mouth out after use --CONTINUE singulair  10 mg nightly --Encourage aerobic activity 20-30 min daily.  --Please call anytime for pulmonary rehab after your knee physical therapy is completed

## 2024-04-22 NOTE — Progress Notes (Signed)
 Subjective:   PATIENT ID: Regina Combs GENDER: female DOB: Feb 17, 1951, MRN: [id number]  Chief Complaint  Patient presents with   Asthma    Reason for Visit: Follow-up  Regina Combs is a 73 year old female never smoker with allergic rhinitis, chronic bronchitis, asthma, HTN, HLD who presents for asthma follow-up  Synopsis Every three years she has severe bronchitis. This year her symptoms have seemed prolonged. She developed productive cough, shortness of breath that began two months ago when she was treated for flu on 05/17/22 with her PCP. Denies wheezing. She continued to have persistent symptoms and treated for asthma exacerbation with prednisone  on 05/31/22, 06/14/22 and 07/02/22. She recently completed her third round of steroids by this visit. On cough syrup nightly. Cough will return after stopping the prednisone . Worse in the evening and at night. Triggered by walking, wind and cold. Hoarseness developed before starting inhalers. She is currently on Advair 250-50. Has been compliant for the last two weeks and unsure if it helps but maybe has some improvement. Previously using rescue inhaler but not currently using. Hx of allergy shots  10/10/22 Since our last visit she was started on Fasenra  on 08/30/22. Has had two injections. Felt like she went through pollen without issue. Her triggers usually occur in winter with cold air, illness and environmental exposures. Denies shortness of breath, wheezing and cough. No longer taking allergy shots since December 2023.   06/07/23 Since our last visit she has been on Fasenra  from April to December however recently denied this year for PAP per our pharmacy department but patient states she spoke to [employer] and has eligibility. She has been doing well with no exacerbations since Feb 2024. Denies shortness of breath coughing or wheezing. Winter is usually her worst time and she is asymptomatic.  04/22/24 Since our last visit she is overall doing well.  She is on Fasenra  and low dose Advair. She had Covid in July and had prolonged exhaustion. She was able to go on cruise with friends and was fatigued. No exacerbations since then. Has occasional wheeze at night once a week. Rarely uses rescue inhaler  Asthma Control Test ACT Total Score  04/22/2024  1:41 PM 23  06/07/2023  9:39 AM 25  07/19/2022 10:31 AM 12   Exacerbations requiring steroids/antibiotics 2023 Jan Feb Mar April May June July Aug Sept Oct Nov Dec              X  2024 Jan Feb Mar April May June July Aug Sept Oct Nov Dec   XX X  O          2025 Jan Feb Mar April May June July Aug Sept Oct Nov Dec         X        Social History: Second hand exposure Retired Chief Operating Officer Her dog of 13 years recently passed due to seizures   Past Medical History:  Diagnosis Date   Arthritis    left knee osteoarthritis.   Asthma    no recent issues since a yr ago-Uses Inhalers as needed.   GERD (gastroesophageal reflux disease)    omeprazole  helps.   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     Family History  Problem Relation Age of Onset   Breast cancer Mother 34     Social History   Occupational History   Not on file  Tobacco Use   Smoking status: Never   Smokeless tobacco: Never  Substance and Sexual Activity   Alcohol use: Yes    Comment: social -rare   Drug use: No   Sexual activity: Not Currently    Birth control/protection: Surgical    Allergies  Allergen Reactions   Pravastatin     Other reaction(s): Other Brain fog   Food Other (See Comments)    Egg whites-gastrointestinal symptoms    Gluten Meal Other (See Comments)    gastrointestinal symptoms   Sulfa Antibiotics Other (See Comments)    Childhood reaction; unsure of reaction.   Adhesive [Tape] Rash    Steri strips     Diclofenac Diarrhea and Nausea And Vomiting     Outpatient Medications Prior to Visit  Medication Sig Dispense Refill   albuterol  (PROVENTIL  HFA;VENTOLIN  HFA) 108 (90 Base) MCG/ACT inhaler Inhale 2 puffs into the lungs every 6 (six) hours as needed.     amLODipine  (NORVASC ) 10 MG tablet Take 10 mg by mouth daily.     aspirin  EC 81 MG tablet Take 81 mg by mouth daily.     benralizumab  (FASENRA  PEN) 30 MG/ML prefilled autoinjector Inject 1 mL (30 mg total) into the skin every 8 (eight) weeks. ** Need appointment for further refills. ** 1 mL 0   FLUoxetine (PROZAC) 10 MG capsule Take by mouth.     fluticasone -salmeterol (ADVAIR) 100-50 MCG/ACT AEPB Inhale 1 puff into the lungs 2 (two) times daily. 60 each 11   ibuprofen (ADVIL) 200 MG tablet Take 200 mg by mouth every 6 (six) hours as needed for moderate pain.     losartan (COZAAR) 100 MG tablet Take 100 mg by mouth daily.     omeprazole  (PRILOSEC) 40 MG capsule Take 40 mg by mouth daily.     rosuvastatin (CRESTOR) 5 MG tablet Take 5 mg by mouth daily.     montelukast  (SINGULAIR ) 10 MG tablet Take 1 tablet (10 mg total) by mouth at bedtime. 30 tablet 5   albuterol  (PROVENTIL ) (2.5 MG/3ML) 0.083% nebulizer solution Take 2.5 mg by nebulization every 6 (six) hours as needed for wheezing or shortness of breath. (Patient not taking: Reported on 04/22/2024)     famotidine (PEPCID) 20 MG tablet Take 20 mg by mouth 2 (two) times daily.     gabapentin (NEURONTIN) 400 MG capsule 1 capsule (Patient not taking: Reported on 04/22/2024)     No facility-administered medications prior to visit.    Review of Systems  Constitutional:  Positive for malaise/fatigue. Negative for chills, diaphoresis, fever and weight loss.  HENT:  Negative for congestion.    Respiratory:  Negative for cough, hemoptysis, sputum production, shortness of breath and wheezing.   Cardiovascular:  Negative for chest pain, palpitations and leg swelling.     Objective:   Vitals:   04/22/24 1315  BP: 130/63  Pulse: 63  Temp: 98.5 F (36.9 C)  SpO2: 99%  Weight: 197 lb 14.4 oz (89.8 kg)  Height: 5' 4 (1.626 m)   SpO2: 99 %  Physical Exam: General: Well-appearing, no acute distress HENT: [place], AT Eyes: EOMI, no scleral icterus Respiratory: Clear to auscultation bilaterally.  No crackles, wheezing  or rales Cardiovascular: RRR, -M/R/G, no JVD Extremities:-Edema,-tenderness Neuro: AAO x4, CNII-XII grossly intact Psych: Normal mood, normal affect  Data Reviewed:  Imaging: CTA 03/27/17 - No PE. Ill-defined RLL nodule 5 x 4 mm CXR 05/18/22 (Report only) - FINDINGS: Lungs/pleura: No consolidation or effusion. Heart/mediastinum: Normal size and contours. Bones: No acute findings. IMPRESSION: No acute cardiopulmonary disease.  CXR 06/22/22 (Report only) - FINDINGS: Normal heart size. The lungs and pleural spaces are clear. Thoracic spondylosis. Cholecystectomy clips. IMPRESSION: No active disease.   PFT: FVC 2.88 (94%) FEV1 2.39 (104%) Ratio 83  TLC 159% RV 141% DLCO 91%. +BD response in FEV1 Interpretation: Normal spirometry however significant bronchodilator response present . Reversible obstructive defect consistent with asthma. Air trapping and hyperinflation present.  Labs: CBC    Component Value Date/Time   WBC 10.2 09/04/2019 1811   RBC 4.79 09/04/2019 1811   HGB 13.4 09/04/2019 1811   HCT 41.6 09/04/2019 1811   PLT 354 09/04/2019 1811   MCV 86.8 09/04/2019 1811   MCH 28.0 09/04/2019 1811   MCHC 32.2 09/04/2019 1811   RDW 14.0 09/04/2019 1811   LYMPHSABS 2.2 09/04/2019 1811   MONOABS 0.8 09/04/2019 1811   EOSABS 0.2 09/04/2019 1811   BASOSABS 0.1 09/04/2019 1811   Labs 09/04/19 - 200 02/08/22 - 300     Assessment & Plan:   Discussion: 73  year old female never smoker with allergic rhinitis, chronic bronchitis, asthma, HTN, HLD who presents for asthma follow-up. Overall doing well on biologic and low dose ICS/LABA. Started Fasenra  08/30/22. Had one exacerbation in the last 12 months due to covid.  Severe persistent asthma, steroid dependent, well controlled. No steroids in last 2 months --CONTINUE Fasenra  --CONTINUE Advair 100-50 mcg ONE puff in morning and evening. Rinse mouth out after use --CONTINUE singulair  10 mg nightly --Encourage aerobic activity 20-30 min daily.  --Please call anytime for pulmonary rehab after your knee physical therapy is completed  RLL subcentimeter nodule --Low risk. No follow-up needed  Health Maintenance Immunization History  Administered Date(s) Administered   Influenza-Unspecified 02/28/2023   Moderna Covid-19 Fall Seasonal Vaccine 43yrs & older 02/21/2022   PFIZER(Purple Top)SARS-COV-2 Vaccination 07/02/2019, 07/28/2019, 02/29/2020   Pfizer(Comirnaty)Fall Seasonal Vaccine 12 years and older 02/28/2023   Pneumococcal Polysaccharide-23 02/25/2020, 07/06/2021   CT Lung Screen - not qualified. Never smoker  No orders of the defined types were placed in this encounter.  Meds ordered this encounter  Medications   montelukast  (SINGULAIR ) 10 MG tablet    Sig: Take 1 tablet (10 mg total) by mouth at bedtime.    Dispense:  90 tablet    Refill:  3    Return in about 6 months (around 10/20/2024).  I have spent a total time of 30-minutes on the day of the appointment including chart review, data review, collecting history, coordinating care and discussing medical diagnosis and plan with the patient/family. Past medical history, allergies, medications were reviewed. Pertinent imaging, labs and tests included in this note have been reviewed and interpreted independently by me.  Jazzlyn Huizenga Slater Staff, MD [facility]

## 2024-04-22 NOTE — Telephone Encounter (Signed)
 Refill sent for FASENRA  to [facility]: [phone]   Dose: Fasenra  30mg  [place] every 8 weeks  Last OV: 04/22/24 Provider: Dr. Kassie  Next OV: due May 2026  Aleck Puls, PharmD, BCPS Clinical Pharmacist  [facility]

## 2024-04-24 ENCOUNTER — Other Ambulatory Visit: Payer: Self-pay

## 2024-04-24 ENCOUNTER — Other Ambulatory Visit (HOSPITAL_COMMUNITY): Payer: Self-pay

## 2024-04-29 ENCOUNTER — Other Ambulatory Visit: Payer: Self-pay

## 2024-06-03 ENCOUNTER — Telehealth: Payer: Self-pay

## 2024-06-03 ENCOUNTER — Other Ambulatory Visit (HOSPITAL_COMMUNITY): Payer: Self-pay

## 2024-06-03 NOTE — Telephone Encounter (Signed)
 Submitted a Prior Authorization request to [facility] for FASENRA  via CoverMyMeds. Authorization has been CANCELLED due to pa not needed. Unclear if there is an end date.     CMM KEY: [id number]

## 2024-06-04 ENCOUNTER — Encounter (HOSPITAL_BASED_OUTPATIENT_CLINIC_OR_DEPARTMENT_OTHER): Payer: Self-pay | Admitting: Pulmonary Disease

## 2024-06-05 ENCOUNTER — Other Ambulatory Visit (HOSPITAL_COMMUNITY): Payer: Self-pay

## 2024-06-09 ENCOUNTER — Other Ambulatory Visit (HOSPITAL_COMMUNITY): Payer: Self-pay

## 2024-06-09 NOTE — Telephone Encounter (Signed)
 Pt enrolled in Asthma grant through PAF:  BIN: [id number] PCN: [id number] Group: [id number] ID: [id number]

## 2024-06-17 ENCOUNTER — Other Ambulatory Visit: Payer: Self-pay

## 2024-06-17 ENCOUNTER — Other Ambulatory Visit (HOSPITAL_COMMUNITY): Payer: Self-pay

## 2024-06-17 NOTE — Progress Notes (Signed)
 Specialty Pharmacy Refill Coordination Note  Mirha Brucato is a 74 y.o. female contacted today regarding refills of specialty medication(s) Benralizumab  (Fasenra  Pen)   Patient requested [name] at [facility] at [place] date: 06/18/24   Medication will be filled on: 06/18/24  Patient is aware of $503.99 after grant and insurance.

## 2024-06-18 ENCOUNTER — Other Ambulatory Visit: Payer: Self-pay

## 2024-06-22 ENCOUNTER — Other Ambulatory Visit (HOSPITAL_BASED_OUTPATIENT_CLINIC_OR_DEPARTMENT_OTHER): Payer: Self-pay | Admitting: Pulmonary Disease

## 2024-10-26 ENCOUNTER — Ambulatory Visit (HOSPITAL_COMMUNITY): Admit: 2024-10-26 | Admitting: Orthopedic Surgery

## 2024-10-26 SURGERY — ARTHROPLASTY, KNEE, TOTAL
Anesthesia: Choice | Site: Knee | Laterality: Right
